# Patient Record
Sex: Female | Born: 1965
Health system: Southern US, Community
[De-identification: ages and names within clinical notes are randomized; demographics above are authoritative.]

## PROBLEM LIST (undated history)

## (undated) DIAGNOSIS — M549 Dorsalgia, unspecified: Secondary | ICD-10-CM

## (undated) DIAGNOSIS — Z87442 Personal history of urinary calculi: Secondary | ICD-10-CM

## (undated) DIAGNOSIS — R011 Cardiac murmur, unspecified: Secondary | ICD-10-CM

## (undated) DIAGNOSIS — E7800 Pure hypercholesterolemia, unspecified: Secondary | ICD-10-CM

## (undated) DIAGNOSIS — E119 Type 2 diabetes mellitus without complications: Secondary | ICD-10-CM

## (undated) DIAGNOSIS — I10 Essential (primary) hypertension: Secondary | ICD-10-CM

## (undated) DIAGNOSIS — K760 Fatty (change of) liver, not elsewhere classified: Secondary | ICD-10-CM

## (undated) DIAGNOSIS — N289 Disorder of kidney and ureter, unspecified: Secondary | ICD-10-CM

## (undated) DIAGNOSIS — N200 Calculus of kidney: Secondary | ICD-10-CM

## (undated) DIAGNOSIS — M5135 Other intervertebral disc degeneration, thoracolumbar region: Secondary | ICD-10-CM

## (undated) HISTORY — PX: ADENOIDECTOMY: SUR15

## (undated) HISTORY — PX: OOPHORECTOMY: SHX86

## (undated) HISTORY — PX: COLONOSCOPY: SHX174

## (undated) HISTORY — PX: BACK SURGERY: SHX140

## (undated) HISTORY — PX: ABDOMINAL HYSTERECTOMY: SHX81

## (undated) HISTORY — PX: TONSILLECTOMY: SUR1361

---

## 2010-10-31 ENCOUNTER — Emergency Department (HOSPITAL_COMMUNITY): Admission: EM | Admit: 2010-10-31 | Discharge: 2010-11-01 | Payer: Self-pay | Admitting: Emergency Medicine

## 2011-02-28 LAB — COMPREHENSIVE METABOLIC PANEL
Albumin: 3.4 g/dL — ABNORMAL LOW (ref 3.5–5.2)
Alkaline Phosphatase: 51 U/L (ref 39–117)
BUN: 18 mg/dL (ref 6–23)
CO2: 24 mEq/L (ref 19–32)
Chloride: 102 mEq/L (ref 96–112)
GFR calc non Af Amer: >60 mL/min (ref >60)
Glucose, Bld: 106 mg/dL — ABNORMAL HIGH (ref 70–99)
Potassium: 3.9 mEq/L (ref 3.5–5.1)
Total Bilirubin: 0.3 mg/dL (ref 0.3–1.2)

## 2011-02-28 LAB — CBC
HCT: 37.5 % (ref 36.0–46.0)
MCH: 30.4 pg (ref 26.0–34.0)
MCV: 88.4 fL (ref 78.0–100.0)
RBC: 4.24 MIL/uL (ref 3.87–5.11)
WBC: 8.9 10*3/uL (ref 4.0–10.5)

## 2011-02-28 LAB — POCT CARDIAC MARKERS: Myoglobin, poc: 49.3 ng/mL (ref 12–200)

## 2012-09-26 ENCOUNTER — Emergency Department (HOSPITAL_COMMUNITY): Payer: Managed Care, Other (non HMO)

## 2012-09-26 ENCOUNTER — Ambulatory Visit (INDEPENDENT_AMBULATORY_CARE_PROVIDER_SITE_OTHER): Payer: Managed Care, Other (non HMO) | Admitting: Family Medicine

## 2012-09-26 ENCOUNTER — Emergency Department (HOSPITAL_COMMUNITY)
Admission: EM | Admit: 2012-09-26 | Discharge: 2012-09-26 | Disposition: A | Discharge status: Home / Self Care | Payer: Managed Care, Other (non HMO) | Attending: Emergency Medicine | Admitting: Emergency Medicine

## 2012-09-26 ENCOUNTER — Encounter (HOSPITAL_COMMUNITY): Payer: Self-pay | Admitting: Emergency Medicine

## 2012-09-26 VITALS — BP 138/74 | HR 85 | Temp 97.9°F | Resp 16 | Ht 61.0 in | Wt 186.0 lb

## 2012-09-26 DIAGNOSIS — R51 Headache: Secondary | ICD-10-CM

## 2012-09-26 DIAGNOSIS — R42 Dizziness and giddiness: Secondary | ICD-10-CM | POA: Insufficient documentation

## 2012-09-26 LAB — POCT I-STAT, CHEM 8
Calcium, Ion: 1.16 mmol/L (ref 1.12–1.23)
Creatinine, Ser: 1 mg/dL (ref 0.50–1.10)
Glucose, Bld: 101 mg/dL — ABNORMAL HIGH (ref 70–99)
Hemoglobin: 14.3 g/dL (ref 12.0–15.0)
Potassium: 4.1 mEq/L (ref 3.5–5.1)

## 2012-09-26 MED ORDER — ONDANSETRON HCL 4 MG/2ML IJ SOLN
4.0000 mg | Freq: Once | INTRAMUSCULAR | Status: AC
  Administered 2012-09-26: 4 mg via INTRAVENOUS
  Filled 2012-09-26: qty 2

## 2012-09-26 MED ORDER — MORPHINE SULFATE 4 MG/ML IJ SOLN
4.0000 mg | Freq: Once | INTRAMUSCULAR | Status: AC
  Administered 2012-09-26: 4 mg via INTRAVENOUS
  Filled 2012-09-26: qty 1

## 2012-09-26 MED ORDER — KETOROLAC TROMETHAMINE 30 MG/ML IJ SOLN
30.0000 mg | Freq: Once | INTRAMUSCULAR | Status: AC
  Administered 2012-09-26: 30 mg via INTRAVENOUS
  Filled 2012-09-26: qty 1

## 2012-09-26 NOTE — ED Provider Notes (Signed)
History     CSN: [id number]  Arrival date & time 09/26/12  1637   First MD Initiated Contact with Patient 09/26/12 1638      Chief Complaint  Patient presents with  . Headache    (Consider location/radiation/quality/duration/timing/severity/associated sxs/prior treatment) HPI Comments: Patient sent from [facility] for evaluation of headache.  Pt reports she was awoken this morning by a sudden sharp pain in the top of her head.  The pain is described as stabbing, initially constant, now waxing and waning.  When at its worse, it is 10/10 intensity.  Pt had an episode of dizziness (room spinning) at urgent care, and thus was sent for further evaluation.  No palliative or exacerbating factors.  Denies focal neurological deficits.  No sensitivity to light or sound.  Denies fevers, recent falls, recent illness, sick contacts, recent travel, any tick exposures.  Per note from [facility], while patient was being seen her headache worsened and she began having dizziness and confusion about her DOB.    Patient is a 46 y.o. female presenting with headaches. The history is provided by the patient.  Headache  Pertinent negatives include no fever.    No past medical history on file.  No past surgical history on file.  No family history on file.  History  Substance Use Topics  . Smoking status: Never Smoker   . Smokeless tobacco: Not on file  . Alcohol Use: No    OB History    Grav Para Term Preterm Abortions TAB SAB Ect Mult Living                  Review of Systems  Constitutional: Negative for fever and chills.  HENT: Negative for neck pain and neck stiffness.   Eyes: Negative for visual disturbance.  Neurological: Positive for dizziness, light-headedness and headaches. Negative for weakness and numbness.       Chronic occasional numbness in her bilateral arms at night and with prolonged raising of arms.     Allergies  Review of patient's allergies indicates no  known allergies.  Home Medications  No current outpatient prescriptions on file.  BP 131/79  Pulse 78  Temp 98.5 F (36.9 C)  Resp 18  SpO2 99%  Physical Exam  Nursing note and vitals reviewed. Constitutional: She appears well-developed and well-nourished. No distress.  HENT:  Head: Normocephalic and atraumatic.  Eyes: Conjunctivae normal and EOM are normal.  Neck: Normal range of motion. Neck supple.  Pulmonary/Chest: Effort normal.  Neurological: She is alert. She has normal strength. No cranial nerve deficit or sensory deficit. She exhibits normal muscle tone. She displays a negative Romberg sign. Coordination and gait normal. GCS eye subscore is 4. GCS verbal subscore is 5. GCS motor subscore is 6.       CN II-XII intact, EOMs intact, no pronator drift, grip strengths equal bilaterally; strength 5/5 in all extremities, sensation intact in all extremities; finger to nose, heel to shin, rapid alternating movements normal; gait is normal.     Skin: She is not diaphoretic.    ED Course  Procedures (including critical care time)  Labs Reviewed  POCT I-STAT, CHEM 8 - Abnormal; Notable for the following:    Glucose, Bld 101 (*)     All other components within normal limits   Ct Head Wo Contrast  09/26/2012  *RADIOLOGY REPORT*  Clinical Data: Sharp pain involving the right side of the head  CT HEAD WITHOUT CONTRAST  Technique:  Contiguous axial images were obtained from the base of the skull through the vertex without contrast.  Comparison: None.  Findings:  Wallace Cullens white differentiation is maintained.  No CT evidence of acute large territory infarct.  No intraparenchymal or extra-axial mass or hemorrhage.  Normal size and configuration of the ventricles and basilar cisterns.  No midline shift.  Limited visualization of the paranasal sinuses and mastoid air cells are normal.  Regional soft tissues are normal.  No definite displaced calvarial fracture.  IMPRESSION: Negative noncontrast  head CT.   Original Report Authenticated By: Waynard Reeds, M.D.     5:39 PM Pt discussed with Dr Bebe Shaggy who will also see the patient.    6:30 PM Patient reports pain is now 5/10, down from 10/10.  Now complaining of her bilateral carpal tunnel pain and tingling.  Denies weakness.  Phalen's and tinel's tests are positive.    7:47 PM Pt reports headache is now 1/10 after toradol.  Feeling much better.  Neurological exam remains normal.  CN II-XII intact, EOMs intact, no pronator drift, grip strengths equal bilaterally; strength 5/5 in all extremities, sensation intact in all extremities; finger to nose, heel to shin, rapid alternating movements normal; gait is normal.  Plan is for d/c home.    1. Headache     MDM  Pt p/w headache, sent from [facility].  Neurologically intact on my exam.  No trauma, no meningeal signs.  No red flags for headache by history.  CT obtained given history and documentation at urgent care.  CT was negative.  Pt improved quickly with pain medication and resolved.  Pt also seen by Dr Bebe Shaggy.  Pt d/c home with PCP follow up.  Discussed all results with patient.  Pt given return precautions.  Pt verbalizes understanding and agrees with plan.           Iva, [place] 09/27/12 0106

## 2012-09-26 NOTE — Progress Notes (Signed)
[facility] [address] [place] [address] [phone]  Date:  09/26/2012   Name:  Tara Blevins   DOB:  July 27, 1966   MRN:  [id number]  PCP:  No primary provider on file.    Chief Complaint: Headache and Numbness   History of Present Illness:  Tara Blevins is a 46 y.o. very pleasant female patient who presents with the following:  She is here today to discuss some problems.  She has noted numbness in both arms for a month or more.  This problem will come and go- worse at night or when she is driving.  She has not been dropping objects.  Her hands do not feel weak, but they do feel tight.    Since this morning she has noted sharp pains in the right side of her head.  This started around 6:30 am and woke her from sleep.  This is also coming and going.  The pains will last about a minute or so- occuring every 10 minutes. She has never had a HA like this in the past.   No changes in her vision or hearing.  She knows that she has poor vision and probably needs to get glasses.    No family history of aneurysms.  There is a family history of heart problems.    She also notes a pain in the bottom of her left foot only with walking for the last month or so.    She has never been a smoker.  She is not taking any blood thinners or asthma.  She has not hit her head or had any trauma.  Tara Blevins was trying to work today- she is in [employer]- but she would be unable to work when the pains would hit and would have to stop until it passed.   There is no problem list on file for this patient.   No past medical history on file.  No past surgical history on file.  History  Substance Use Topics  . Smoking status: Never Smoker   . Smokeless tobacco: Not on file  . Alcohol Use: No    No family history on file.  No Known Allergies  Medication list has been reviewed and updated.  No current outpatient prescriptions on file prior to visit.    Review of  Systems:  As per HPI- otherwise negative. She does not typically visit doctors  Physical Examination: Filed Vitals:   09/26/12 1510  BP: 138/74  Pulse: 85  Temp: 97.9 F (36.6 C)  Resp: 16   Filed Vitals:   09/26/12 1510  Height: 5\' 1"  (1.549 m)  Weight: 186 lb (84.369 kg)   Body mass index is 35.14 kg/(m^2). Ideal Body Weight: Weight in (lb) to have BMI = 25: 132   GEN: WDWN, NAD, Non-toxic, A & O x 3.  Will stop what she is doing every 5- 10 minutes to clutch the right side of her head for a minute or so.   HEENT: Atraumatic, Normocephalic. Neck supple. No masses, No LAD.  There is no lesion on her scalp.  The scalp is not tender to touch.  Bilateral TM wnl, oropharynx normal.  PEERL,EOMI.  Limited funduscopic exam normal.    Ears and Nose: No external deformity. CV: RRR, No M/G/R. No JVD. No thrill. No extra heart sounds. PULM: CTA B, no wheezes, crackles, rhonchi. No retractions. No resp. distress. No accessory muscle use. EXTR: No c/c/e.  She has  normal strength and DTR in all extremities.  Normal perfusion.  She is not having the numbness of her arms/ hands currently.  She has tenderness on the underside of her left foot by the 1st/ 2nd metatarsal heads.  Tender to squeeze- possible neuroma NEURO Normal gait.  PSYCH: Normally interactive. Conversant. Not depressed or anxious appearing.  Calm demeanor.   We had planned to have Tara Blevins proceed to the ED with her significant other.  However, while waiting in clinic her symptoms worsened and she was having more persistent and severe head pains. She also seemed to be having some confusion about her DOB, what day of the week it is, etc.  She also noted dizziness and more persistent numbness in her hands and arms.  We then changed plans and decided to send her to the ED via EMS.    Assessment and Plan: 1. Headache    Alerted a triage nurse at [facility] about Tara Blevins who is en route with EMS  Abbe Amsterdam, MD

## 2012-09-26 NOTE — ED Notes (Signed)
Pt to ED via [facility] from [facility] with c/o sharp pain in right side of head st's pain comes and goes and feels like a knife.  Pt alert and oriented x's 3.  Skin warm and dry color appropriate.

## 2012-09-27 NOTE — ED Provider Notes (Signed)
Medical screening examination/treatment/procedure(s) were conducted as a shared visit with non-physician practitioner(s) and myself.  I personally evaluated the patient during the encounter  Pt well appearing, no distress, no focal neuro deficits.  On my exam, she was tender to palpation of scalp but no erythema/abscess noted   Joya Gaskins, MD 09/27/12 0110

## 2013-11-04 ENCOUNTER — Emergency Department (HOSPITAL_COMMUNITY)
Admission: EM | Admit: 2013-11-04 | Discharge: 2013-11-04 | Disposition: A | Discharge status: Home / Self Care | Payer: Managed Care, Other (non HMO) | Source: Home / Self Care | Attending: Family Medicine | Admitting: Family Medicine

## 2013-11-04 ENCOUNTER — Encounter (HOSPITAL_COMMUNITY): Payer: Self-pay | Admitting: Emergency Medicine

## 2013-11-04 ENCOUNTER — Emergency Department (INDEPENDENT_AMBULATORY_CARE_PROVIDER_SITE_OTHER): Payer: Managed Care, Other (non HMO)

## 2013-11-04 DIAGNOSIS — J069 Acute upper respiratory infection, unspecified: Secondary | ICD-10-CM

## 2013-11-04 MED ORDER — GUAIFENESIN-CODEINE 100-10 MG/5ML PO SOLN: 5.0000 mL | Freq: Every evening | ORAL | Status: DC | PRN

## 2013-11-04 MED ORDER — IPRATROPIUM BROMIDE 0.06 % NA SOLN: 2.0000 | Freq: Four times a day (QID) | NASAL | Status: DC

## 2013-11-04 NOTE — ED Notes (Signed)
Pt c/o pain in the back of her head onset Monday night Reports she fell on Saturday but denies head inj/trauma... She felt weak and dizzy and fell onto carpet flooring The pain will increases when she coughs or moves Has had a cold x2 weeks Denies: f/v/n/d, blurry vision, dizziness Alert w/no signs of acute distress; steady gait w/NAD

## 2013-11-04 NOTE — ED Provider Notes (Signed)
Tara Blevins is a 47 y.o. female who presents to Urgent Care today for  1) cough, nasal congestion subjective fevers and chills. This is been present for about 2 weeks. Patient is tried some over-the-counter medications which have not been very effective. She denies any chest pain palpitations nausea vomiting or diarrhea.  2) scalp contusion: Patient tripped getting out of bed Monday evening hitting the right lateral aspect of her scalp on her dresser. She denies any loss of consciousness nausea vomiting blurry vision dizziness unsteadiness. She denies any lack of coordination weakness or numbness. She notes tenderness along the scalp where she was struck but otherwise feels well.    History reviewed. No pertinent past medical history. History  Substance Use Topics  . Smoking status: Never Smoker   . Smokeless tobacco: Not on file  . Alcohol Use: No   ROS as above Medications reviewed. No current facility-administered medications for this encounter.   Current Outpatient Prescriptions  Medication Sig Dispense Refill  . guaiFENesin-codeine 100-10 MG/5ML syrup Take 5 mLs by mouth at bedtime as needed for cough.  120 mL  0  . ipratropium (ATROVENT) 0.06 % nasal spray Place 2 sprays into both nostrils 4 (four) times daily.  15 mL  1    Exam:  BP 133/97  Pulse 85  Temp(Src) 98.5 F (36.9 C) (Oral)  Resp 20  SpO2 97% Gen: Well NAD HEENT: EOMI,  MMM, posterior pharynx with cobblestoning. Tympanic membranes are normal appearing bilaterally. PERRLA Scalp: Tender area along the right lateral skull posterior and superior to the ear.  Lungs: CTABL Nl WOB Heart: RRR no MRG Abd: NABS, NT, ND Exts: Non edematous BL  LE, warm and well perfused.  Neuro: Alert and oriented cranial nerves intact coordination intact balance is intact gait intact strength is intact.  No results found for this or any previous visit (from the past 24 hour(s)). Dg Chest 2 View  11/04/2013   CLINICAL DATA:   47 year old female cough and congestion. Headache x2 weeks. Initial encounter.  EXAM: CHEST  2 VIEW  COMPARISON:  [facility] chest radiographs 12/31/2012.  FINDINGS: mildly lower lung volumes on the frontal view. Normal cardiac size and mediastinal contours. Visualized tracheal air column is within normal limits. Lungs remain clear. No pneumothorax or effusion. No acute osseous abnormality identified.  IMPRESSION: No acute cardiopulmonary abnormality.   Electronically Signed   By: Augusto Gamble M.D.   On: 11/04/2013 12:13    Assessment and Plan: 47 y.o. female with  1) viral URI: Plan for symptomatic management Atrovent nasal spray, codeine containing cough medication, and over-the-counter pain medication. 2) scalp contusion: No signs of intracranial process. Plan first symptomatic management watchful waiting and treatment with ibuprofen and Tylenol.  Followup with primary care provider or return if worsening. At that time may need CT scan. Discussed warning signs or symptoms. Please see discharge instructions. Patient expresses understanding.      Rodolph Bong, MD 11/04/13 [phone]

## 2016-10-17 ENCOUNTER — Encounter: Payer: Self-pay | Admitting: Physician Assistant

## 2016-10-17 ENCOUNTER — Ambulatory Visit (INDEPENDENT_AMBULATORY_CARE_PROVIDER_SITE_OTHER): Payer: Managed Care, Other (non HMO)

## 2016-10-17 ENCOUNTER — Ambulatory Visit (INDEPENDENT_AMBULATORY_CARE_PROVIDER_SITE_OTHER): Payer: Managed Care, Other (non HMO) | Admitting: Physician Assistant

## 2016-10-17 VITALS — BP 122/82 | HR 86 | Temp 98.3°F | Resp 16 | Ht 61.0 in | Wt 187.8 lb

## 2016-10-17 DIAGNOSIS — M5442 Lumbago with sciatica, left side: Secondary | ICD-10-CM | POA: Diagnosis not present

## 2016-10-17 DIAGNOSIS — M6281 Muscle weakness (generalized): Secondary | ICD-10-CM | POA: Diagnosis not present

## 2016-10-17 DIAGNOSIS — M5417 Radiculopathy, lumbosacral region: Secondary | ICD-10-CM

## 2016-10-17 MED ORDER — CYCLOBENZAPRINE HCL 10 MG PO TABS: 10.0000 mg | ORAL_TABLET | Freq: Three times a day (TID) | ORAL | 0 refills | Status: DC | PRN

## 2016-10-17 MED ORDER — PREDNISONE 20 MG PO TABS: ORAL_TABLET | ORAL | 0 refills | Status: DC

## 2016-10-17 NOTE — Progress Notes (Signed)
Tara Blevins  MRN: [id number] DOB: Jun 02, 1966  PCP: Default, Provider, MD  Subjective:  Pt is a 50 year old female presents to clinic for low back pain x one week. Last week she was pushing her cleaning cart when she felt pain in her low back, "felt like somebody punched me". Pain radiates down back of left leg to bottom of foot. States when she walks "feels like pins and needles" on her left heel.  Pain is present all day, does not get better. States when she sits for a long time she notes a tingling feeling to left leg. Notes associated weakness of her left thigh. Back pain keeps her up at night. Sleeps with pillow between leg, doesn't help. Nothing makes it better. Standing up and sitting for long periods makes it worse. She has tried Aleve, ibuprofen 800 mg once, heating pad, stretches, didn't help.  Denies saddle paresthesias, loss of bowel or bladder function.  No history of back pain or injury.   She has two jobs: [employer]. Is a cook at [employer].   Notes she is going on vacation Nov 15-20.  Review of Systems  Constitutional: Positive for activity change (due to back pain).  Cardiovascular: Negative.   Gastrointestinal: Negative for constipation and diarrhea.  Genitourinary: Negative for difficulty urinating, frequency, pelvic pain and urgency.  Musculoskeletal: Positive for back pain and gait problem. Negative for joint swelling, neck pain and neck stiffness.  Neurological: Positive for weakness (left leg). Negative for numbness.    There are no active problems to display for this patient.   Current Outpatient Prescriptions on File Prior to Visit  Medication Sig Dispense Refill  . guaiFENesin-codeine 100-10 MG/5ML syrup Take 5 mLs by mouth at bedtime as needed for cough. 120 mL 0  . ipratropium (ATROVENT) 0.06 % nasal spray Place 2 sprays into both nostrils 4 (four) times daily. (Patient not taking: Reported on 10/17/2016) 15 mL 1   No  current facility-administered medications on file prior to visit.     No Known Allergies  Objective:  BP 122/82 (BP Location: Right Arm, Patient Position: Sitting, Cuff Size: Small)   Pulse 86   Temp 98.3 F (36.8 C) (Oral)   Resp 16   Ht 5\' 1"  (1.549 m)   Wt 187 lb 12.8 oz (85.2 kg)   SpO2 97%   BMI 35.48 kg/m   Physical Exam  Constitutional: She is oriented to person, place, and time and well-developed, well-nourished, and in no distress. No distress.  Cardiovascular: Normal rate, regular rhythm and normal heart sounds.   Neurological: She is alert and oriented to person, place, and time. She has an abnormal Straight Leg Raise Test. Gait normal. GCS score is 15.  Strength is 4/5 withdorsal, plantar flexion, flexion and extension of low leg against resistance on left side. Strength is 5/5 on right.  Not able to appreciate reflexes b/l, as patient could not relax.  Abnormal sensation dorsal and plantar aspect of left foot and posterior lower left leg compared to right. She describes it as "tingling". Patient is able to walk on toes and heels.   Skin: Skin is warm and dry.  Psychiatric: Mood, memory, affect and judgment normal.  Vitals reviewed.   Dg Lumbar Spine Complete  Result Date: 10/17/2016 CLINICAL DATA:  Low back pain with sciatica EXAM: LUMBAR SPINE - COMPLETE 4+ VIEW COMPARISON:  05/19/2010 FINDINGS: Five non rib-bearing lumbar type vertebra. SI joints are patent. Minimal retrolisthesis  of L5 on S1. Lumbar alignment otherwise unremarkable. No fracture. Vertebral body heights are maintained. Anterior osteophytes present at most levels of the lumbar spine. Multilevel degenerative changes of the lower thoracic spine with vacuum discs and anterior osteophytes. IMPRESSION: Mild degenerative changes.  No acute osseous abnormality. Electronically Signed   By: Jasmine PangKim  Fujinaga M.D.   On: 10/17/2016 15:59   Assessment and Plan :  1. Lumbosacral radiculopathy 2. Acute bilateral low  back pain with left-sided sciatica 3. Muscle weakness of lower extremity  - predniSONE (DELTASONE) 20 MG tablet; Take 3 PO QAM x3days, 2 PO QAM x3days, 1 PO QAM x3days  Dispense: 18 tablet; Refill: 0 - cyclobenzaprine (FLEXERIL) 10 MG tablet; Take 1 tablet (10 mg total) by mouth 3 (three) times daily as needed for muscle spasms.  Dispense: 30 tablet; Refill: 0 - DG Lumbar Spine Complete; Future - Work note written for a few days off, instructed patient to rest - Patient instructed to RTC as soon as possible if her symptoms of weakness worsen/do not improve or if any saddle paresthesia or loss of bowel or bladder function develops. Consider referral to ortho/neuro surgery at that time for further imaging and work-up.  - Advised patient to follow up in two weeks before she leaves for vacation to monitor improvement. Consider referral to ortho if weakness is still present. Consider physical therapy if some improvemt. She understands and agrees with plan.   Marco CollieWhitney Terris Bodin, PA-C  [facility] 10/17/2016 2:59 PM

## 2016-10-17 NOTE — Patient Instructions (Addendum)
Please take these medications as prescribed.  Return to clinic if your symptoms do not improve or if they worsen. We may refer you to orthopedics at that time.  Herniated discs may take several weeks (3-4) for pain to subside. If you are still experiencing pain after a few weeks, we can refer you to physical therapy.  Please rest and avoid lifting or any activity that will strain your low back. If you are feeling better, please perform light stretches and walk for exercise.    Herniated Disk A herniated disk occurs when a disk in your spine bulges out too far. This condition is also called a ruptured disk or slipped disk. Your spine (backbone) is made up of bones called vertebrae. Between each pair of vertebrae is an oval disk with a soft, spongy center that acts as a shock absorber when you move. The spongy center is surrounded by a tough outer ring. When you have a herniated disk, the spongy center of the disk bulges out or ruptures through the outer ring. A herniated disk can press on a nerve between your vertebrae and cause pain. A herniated disk can occur anywhere in your back or neck area, but the lower back is the most common spot. CAUSES  In many cases, a herniated disk occurs just from getting older. As you age, the spongy insides of your disks tend to shrink and dry out. A herniated disk can result from gradual wear and tear. Injury or sudden strain can also cause a herniated disk.  RISK FACTORS Aging is the main risk factor for a herniated disk. Other risk factors include:  Being a man between the ages of 47 and 70 years.  Having a job that requires heavy lifting, bending, or twisting.  Having a job that requires long hours of driving.  Not getting enough exercise.  Being overweight.  Smoking. SIGNS AND SYMPTOMS  Signs and symptoms depend on which disk is herniated.  For a herniated disk in the lower back, you may have sharp pain in:  One part of your leg, hip, or  buttocks.  The back of your calf.  The top or sole of your foot (sciatica).   For a herniated disk in the neck, you may feel pain:  When you move your neck.  Near or over your shoulder blade.  That moves to your upper arm, forearm, or fingers.   You may also have muscle weakness. It may be hard to:  Lift your leg or arm.  Stand on your toes.  Squeeze tightly with one of your hands.  Other symptoms can include:  Numbness or tingling in the affected areas of your body.  Loss of bladder or bowel control. This is a rare but serious sign of a severe herniated disk in the lower back. DIAGNOSIS  Your health care provider will do a physical exam. During this exam, you may have to move certain body parts or assume various positions. For example, your health care provider may do the straight-leg test. This is a good way to test for a herniated disk in your lower back. In this test, the health care provider lifts your leg while you lie on your back. This is to see if you feel pain down your leg. Your health care provider will also check for numbness or loss of feeling.  Your health care provider will also check your:  Reflexes.  Muscle strength.  Posture.  Other tests may be done to help in making  a diagnosis. These may include:  An X-ray of the spine to rule out other causes of back pain.   Other imaging studies, such as an MRI or CT scan. This is to check whether the herniated disk is pressing on your spinal canal.  Electromyography (EMG). This test checks the nerves that control muscles. It is sometimes used to identify the specific area of nerve involvement.  TREATMENT  In many cases, herniated disk symptoms go away over a period of days or weeks. You will most likely be free of symptoms in 3-4 months. Treatment may include the following:  The initial treatment for a herniated disk is ashort period of rest.  Bed rest is often limited to 1 or 2 days. Resting for too  long delays recovery.  If you have a herniated disk in your lower back, you should avoid sitting as much as possible because sitting increases pressure on the disk.  Medicines. These may include:   Nonsteroidal anti-inflammatory drugs (NSAIDs).  Muscle relaxants for back spasms.  Narcotic pain medicine if your pain is very bad.   Steroid injections. You may need these along the involved nerve root to help control pain. The steroid is injected in the area of the herniated disk. It helps by reducing swelling around the disk.  Physical therapy. This may include exercises to strengthen the muscles that help support your spine.   You may need surgery if other treatments do not work.  HOME CARE INSTRUCTIONS Follow all your health care provider's instructions. These may include:  Take all medicines as directed by your health care provider.  Rest for 2 days and then start moving.  Do not sit or stand for long periods of time.  Maintain good posture when sitting and standing.  Avoid movements that cause pain, such as bending or lifting.  When you are able to start lifting things again:  [place] with your knees.  Keep your back straight.  Hold heavy objects close to your body.  If you are overweight, ask your health care provider to help you start a weight-loss program.  When you are able to start exercising, ask your health care provider how much and what type of exercise is best for you.  Work with a physical therapist on stretching and strengthening exercises for your back.  Do not wear high-heeled shoes.  Do not sleep on your belly.  Do not smoke.  Keep all follow-up visits as directed by your health care provider. SEEK MEDICAL CARE IF:  You have back or neck pain that is not getting better after 4 weeks.  You have very bad pain in your back or neck.  You develop numbness, tingling, or weakness along with pain. SEEK IMMEDIATE MEDICAL CARE IF:   You have  numbness, tingling, or weakness that makes you unable to use your arms or legs.  You lose control of your bladder or bowels.  You have dizziness or fainting.  You have shortness of breath.  MAKE SURE YOU:   Understand these instructions.  Will watch your condition.  Will get help right away if you are not doing well or get worse.   This information is not intended to replace advice given to you by your health care provider. Make sure you discuss any questions you have with your health care provider.   Document Released: 12/01/2000 Document Revised: 12/25/2014 Document Reviewed: 11/07/2013 Elsevier Interactive Patient Education [employer].   Thank you for coming in today. I hope you  feel we met your needs.  Feel free to call [facility] if you have any questions or further requests.  Please consider signing up for MyChart if you do not already have it, as this is a great way to communicate with me.  Best,  Whitney McVey, PA-C   IF you received an x-ray today, you will receive an invoice from [facility]. Please contact [facility] at [phone] with questions or concerns regarding your invoice.   IF you received labwork today, you will receive an invoice from [employer]. Please contact [facility] at [phone] with questions or concerns regarding your invoice.   Our billing staff will not be able to assist you with questions regarding bills from these companies.  You will be contacted with the lab results as soon as they are available. The fastest way to get your results is to activate your My Chart account. Instructions are located on the last page of this paperwork. If you have not heard from [place] regarding the results in 2 weeks, please contact this office.

## 2016-11-13 ENCOUNTER — Encounter (HOSPITAL_BASED_OUTPATIENT_CLINIC_OR_DEPARTMENT_OTHER): Payer: Self-pay

## 2016-11-13 ENCOUNTER — Emergency Department (HOSPITAL_BASED_OUTPATIENT_CLINIC_OR_DEPARTMENT_OTHER)
Admission: EM | Admit: 2016-11-13 | Discharge: 2016-11-13 | Disposition: A | Discharge status: Home / Self Care | Payer: Managed Care, Other (non HMO) | Attending: Emergency Medicine | Admitting: Emergency Medicine

## 2016-11-13 DIAGNOSIS — N3001 Acute cystitis with hematuria: Secondary | ICD-10-CM | POA: Insufficient documentation

## 2016-11-13 DIAGNOSIS — R319 Hematuria, unspecified: Secondary | ICD-10-CM | POA: Diagnosis present

## 2016-11-13 LAB — URINALYSIS, ROUTINE W REFLEX MICROSCOPIC
BILIRUBIN URINE: NEGATIVE
Glucose, UA: NEGATIVE mg/dL
KETONES UR: NEGATIVE mg/dL
NITRITE: NEGATIVE
PROTEIN: 30 mg/dL — AB
Specific Gravity, Urine: 1.027 (ref 1.005–1.030)
pH: 5 (ref 5.0–8.0)

## 2016-11-13 LAB — URINE MICROSCOPIC-ADD ON

## 2016-11-13 MED ORDER — CEPHALEXIN 500 MG PO CAPS: 500.0000 mg | ORAL_CAPSULE | Freq: Three times a day (TID) | ORAL | 0 refills | Status: AC

## 2016-11-13 MED FILL — CEPHALEXIN 500 MG CAPSULE: 500 | 7 days supply | Qty: 21 | Fill #0

## 2016-11-13 NOTE — ED Triage Notes (Signed)
C/o blood in urine x today-NAD-steady gait

## 2016-11-13 NOTE — ED Notes (Signed)
ED Provider at bedside. 

## 2016-11-13 NOTE — ED Provider Notes (Signed)
[facility] Provider Note   CSN: [id number] Arrival date & time: 11/13/16  1435   By signing my name below, I, Teofilo PodMatthew P. Jamison, attest that this documentation has been prepared under the direction and in the presence of Alvira MondayErin Maurie Musco, MD . Electronically Signed: Teofilo PodMatthew P. Jamison, ED Scribe. 11/13/2016. 3:27 PM.   History   Chief Complaint Chief Complaint  Patient presents with  . Hematuria    The history is provided by the patient. No language interpreter was used.   HPI Comments:  Tara Blevins is a 50 y.o. female who presents to the Emergency Department complaining of multiple episodes of hematuria that began today. Pt reports that today at work she urinated at 1145 and her urine was bright red. Pt notes that she has been fatigued for the past week. Pt complains of associated abdominal pain, back pain, chills. No alleviating factors noted. Pt reports PSHx of hysterectomy and oophorectomy. Pt denies injury/trauma, and denies any hx of UTI. Pt denies dysuria, vaginal discharge, vaginal bleeding, constipation, diarrhea, cough, chest pain, SOB, rhinorrhea.   History reviewed. No pertinent past medical history.  There are no active problems to display for this patient.   Past Surgical History:  Procedure Laterality Date  . ABDOMINAL HYSTERECTOMY    . ADENOIDECTOMY    . OOPHORECTOMY    . TONSILLECTOMY      OB History    No data available       Home Medications    Prior to Admission medications   Medication Sig Start Date End Date Taking? Authorizing Provider  cephALEXin (KEFLEX) 500 MG capsule Take 1 capsule (500 mg total) by mouth 3 (three) times daily. 11/13/16 11/20/16  Alvira MondayErin Trudie Cervantes, MD    Family History Family History  Problem Relation Age of Onset  . Heart disease Father     Social History Social History  Substance Use Topics  . Smoking status: Never Smoker  . Smokeless tobacco: Never Used  . Alcohol use No     Allergies     Tomato   Review of Systems Review of Systems  Constitutional: Positive for chills and fatigue. Negative for fever.  HENT: Negative for rhinorrhea and sore throat.   Eyes: Negative for visual disturbance.  Respiratory: Negative for cough and shortness of breath.   Cardiovascular: Negative for chest pain.  Gastrointestinal: Positive for abdominal pain. Negative for constipation and diarrhea.  Genitourinary: Positive for frequency (chronic) and hematuria. Negative for difficulty urinating, dysuria, vaginal bleeding and vaginal discharge.  Musculoskeletal: Positive for back pain. Negative for neck pain.  Skin: Negative for rash.  Neurological: Negative for syncope and headaches.  All other systems reviewed and are negative.    Physical Exam Updated Vital Signs BP 146/91 (BP Location: Right Arm)   Pulse 118   Temp 98 F (36.7 C) (Oral)   Resp 18   Ht 5\' 1"  (1.549 m)   Wt 189 lb 1.6 oz (85.8 kg)   SpO2 99%   BMI 35.73 kg/m   Physical Exam  Constitutional: She is oriented to person, place, and time. She appears well-developed and well-nourished. No distress.  HENT:  Head: Normocephalic and atraumatic.  Eyes: Conjunctivae and EOM are normal.  Neck: Normal range of motion. Neck supple.  Cardiovascular: Normal rate, regular rhythm, normal heart sounds and intact distal pulses.  Exam reveals no gallop and no friction rub.   No murmur heard. Pulmonary/Chest: Effort normal and breath sounds normal. No respiratory distress. She has no wheezes.  She has no rales.  Abdominal: Soft. She exhibits no distension. There is no tenderness. There is no rebound and no guarding.  No cva tenderness  Musculoskeletal: She exhibits no edema or tenderness.  Neurological: She is alert and oriented to person, place, and time.  Skin: Skin is warm and dry. No rash noted. She is not diaphoretic. No erythema.  Psychiatric: She has a normal mood and affect.  Nursing note and vitals reviewed.    ED  Treatments / Results  DIAGNOSTIC STUDIES:  Oxygen Saturation is 99% on RA, normal by my interpretation.    COORDINATION OF CARE:  3:27 PM Discussed treatment plan with pt at bedside and pt agreed to plan.   Labs (all labs ordered are listed, but only abnormal results are displayed) Labs Reviewed  URINALYSIS, ROUTINE W REFLEX MICROSCOPIC (NOT AT [facility]) - Abnormal; Notable for the following:       Result Value   Color, Urine AMBER (*)    APPearance CLOUDY (*)    Hgb urine dipstick LARGE (*)    Protein, ur 30 (*)    Leukocytes, UA SMALL (*)    All other components within normal limits  URINE MICROSCOPIC-ADD ON - Abnormal; Notable for the following:    Squamous Epithelial / LPF 0-5 (*)    Bacteria, UA MANY (*)    All other components within normal limits    EKG  EKG Interpretation None       Radiology No results found.  Procedures Procedures (including critical care time)  Medications Ordered in ED Medications - No data to display   Initial Impression / Assessment and Plan / ED Course  I have reviewed the triage vital signs and the nursing notes.  Pertinent labs & imaging results that were available during my care of the patient were reviewed by me and considered in my medical decision making (see chart for details).  Clinical Course    50 year old female presents with concern for hematuria beginning today. History is not consistent with nephrolithiasis. Urinalysis shows large blood, many bacteria without squamous cells, concerning for urinary tract infection. We'll treat with 1 week of Keflex. No fever, no sign of pyelonephritis on exam.  Provided number to Urology to call in 3 wk if hematuria does not resolve. Patient discharged in stable condition with understanding of reasons to return.   Final Clinical Impressions(s) / ED Diagnoses   Final diagnoses:  Acute cystitis with hematuria    New Prescriptions Discharge Medication List as of 11/13/2016  3:32 PM      START taking these medications   Details  cephALEXin (KEFLEX) 500 MG capsule Take 1 capsule (500 mg total) by mouth 3 (three) times daily., Starting Mon 11/13/2016, Until Mon 11/20/2016, Print      I personally performed the services described in this documentation, which was scribed in my presence. The recorded information has been reviewed and is accurate.     Alvira MondayErin Demorio Seeley, MD 11/13/16 [phone]

## 2016-11-14 ENCOUNTER — Encounter (HOSPITAL_BASED_OUTPATIENT_CLINIC_OR_DEPARTMENT_OTHER): Payer: Self-pay | Admitting: Emergency Medicine

## 2016-11-14 ENCOUNTER — Emergency Department (HOSPITAL_BASED_OUTPATIENT_CLINIC_OR_DEPARTMENT_OTHER)
Admission: EM | Admit: 2016-11-14 | Discharge: 2016-11-14 | Disposition: A | Discharge status: Home / Self Care | Payer: Managed Care, Other (non HMO) | Attending: Emergency Medicine | Admitting: Emergency Medicine

## 2016-11-14 ENCOUNTER — Emergency Department (HOSPITAL_BASED_OUTPATIENT_CLINIC_OR_DEPARTMENT_OTHER): Payer: Managed Care, Other (non HMO)

## 2016-11-14 DIAGNOSIS — R109 Unspecified abdominal pain: Secondary | ICD-10-CM

## 2016-11-14 DIAGNOSIS — N12 Tubulo-interstitial nephritis, not specified as acute or chronic: Secondary | ICD-10-CM | POA: Diagnosis not present

## 2016-11-14 DIAGNOSIS — R319 Hematuria, unspecified: Secondary | ICD-10-CM

## 2016-11-14 HISTORY — DX: Disorder of kidney and ureter, unspecified: N28.9

## 2016-11-14 LAB — URINALYSIS, ROUTINE W REFLEX MICROSCOPIC
BILIRUBIN URINE: NEGATIVE
GLUCOSE, UA: 100 mg/dL — AB
Ketones, ur: NEGATIVE mg/dL
Leukocytes, UA: NEGATIVE
Nitrite: NEGATIVE
Protein, ur: 30 mg/dL — AB
SPECIFIC GRAVITY, URINE: 1.015 (ref 1.005–1.030)
pH: 5.5 (ref 5.0–8.0)

## 2016-11-14 LAB — COMPREHENSIVE METABOLIC PANEL
ALBUMIN: 3.5 g/dL (ref 3.5–5.0)
ALT: 26 U/L (ref 14–54)
AST: 26 U/L (ref 15–41)
Alkaline Phosphatase: 50 U/L (ref 38–126)
Anion gap: 8 (ref 5–15)
BILIRUBIN TOTAL: 0.4 mg/dL (ref 0.3–1.2)
BUN: 14 mg/dL (ref 6–20)
CHLORIDE: 107 mmol/L (ref 101–111)
CO2: 24 mmol/L (ref 22–32)
CREATININE: 0.93 mg/dL (ref 0.44–1.00)
Calcium: 8.6 mg/dL — ABNORMAL LOW (ref 8.9–10.3)
GFR calc Af Amer: >60 mL/min (ref >60)
GLUCOSE: 207 mg/dL — AB (ref 65–99)
Potassium: 4.1 mmol/L (ref 3.5–5.1)
Sodium: 139 mmol/L (ref 135–145)
TOTAL PROTEIN: 6.9 g/dL (ref 6.5–8.1)

## 2016-11-14 LAB — CBC WITH DIFFERENTIAL/PLATELET
BASOS ABS: 0 10*3/uL (ref 0.0–0.1)
BASOS PCT: 1 %
Eosinophils Absolute: 0.2 10*3/uL (ref 0.0–0.7)
Eosinophils Relative: 3 %
HEMATOCRIT: 35.3 % — AB (ref 36.0–46.0)
Hemoglobin: 12.1 g/dL (ref 12.0–15.0)
LYMPHS PCT: 23 %
Lymphs Abs: 1.8 10*3/uL (ref 0.7–4.0)
MCH: 30.6 pg (ref 26.0–34.0)
MCHC: 34.3 g/dL (ref 30.0–36.0)
MCV: 89.4 fL (ref 78.0–100.0)
Monocytes Absolute: 0.8 10*3/uL (ref 0.1–1.0)
Monocytes Relative: 10 %
NEUTROS ABS: 5.2 10*3/uL (ref 1.7–7.7)
NEUTROS PCT: 63 %
Platelets: 282 10*3/uL (ref 150–400)
RBC: 3.95 MIL/uL (ref 3.87–5.11)
RDW: 12.8 % (ref 11.5–15.5)
WBC: 8.1 10*3/uL (ref 4.0–10.5)

## 2016-11-14 LAB — URINE MICROSCOPIC-ADD ON

## 2016-11-14 MED ORDER — KETOROLAC TROMETHAMINE 30 MG/ML IJ SOLN
30.0000 mg | Freq: Once | INTRAMUSCULAR | Status: AC
  Administered 2016-11-14: 30 mg via INTRAVENOUS
  Filled 2016-11-14: qty 1

## 2016-11-14 MED ORDER — HYDROCODONE-ACETAMINOPHEN 5-325 MG PO TABS: 1.0000 | ORAL_TABLET | ORAL | 0 refills | Status: DC | PRN

## 2016-11-14 MED ORDER — HYDROMORPHONE HCL 1 MG/ML IJ SOLN
1.0000 mg | Freq: Once | INTRAMUSCULAR | Status: AC
  Administered 2016-11-14: 1 mg via INTRAVENOUS
  Filled 2016-11-14: qty 1

## 2016-11-14 MED ORDER — SODIUM CHLORIDE 0.9 % IV BOLUS (SEPSIS)
1000.0000 mL | Freq: Once | INTRAVENOUS | Status: AC
  Administered 2016-11-14: 1000 mL via INTRAVENOUS

## 2016-11-14 MED ORDER — ONDANSETRON HCL 4 MG/2ML IJ SOLN
4.0000 mg | Freq: Once | INTRAMUSCULAR | Status: AC
  Administered 2016-11-14: 4 mg via INTRAVENOUS
  Filled 2016-11-14: qty 2

## 2016-11-14 MED ORDER — ONDANSETRON 8 MG PO TBDP: 8.0000 mg | ORAL_TABLET | Freq: Three times a day (TID) | ORAL | 0 refills | Status: DC | PRN

## 2016-11-14 MED ORDER — DEXTROSE 5 % IV SOLN
1.0000 g | Freq: Once | INTRAVENOUS | Status: AC
  Administered 2016-11-14: 1 g via INTRAVENOUS
  Filled 2016-11-14: qty 10

## 2016-11-14 MED FILL — ONDANSETRON ODT 8 MG TABLET: 8 | 3 days supply | Qty: 9 | Fill #0

## 2016-11-14 MED FILL — HYDROCODON-APAP 5-325: 5-325 | 2 days supply | Qty: 12 | Fill #0

## 2016-11-14 NOTE — ED Notes (Signed)
Patient transported to CT 

## 2016-11-14 NOTE — ED Provider Notes (Signed)
TIME SEEN: 5:55 AM  CHIEF COMPLAINT: Right flank pain, nausea and vomiting, dysuria  HPI: Pt is a 50 y.o. female with history of previous kidney stones who presents emergency department right-sided flank pain, nausea, vomiting, dysuria. No fevers or chills. Pain has been increasing in the past several hours. Was seen earlier in the emergency department and diagnosed with urinary tract infection and put on Keflex. States since discharge her symptoms have acutely worsened. No diarrhea or abdominal pain. Has not had a kidney stone in many years. Has had previous lithotripsy and ureteral stents.  ROS: See HPI Constitutional: no fever  Eyes: no drainage  ENT: no runny nose   Cardiovascular:  no chest pain  Resp: no SOB  GI: no vomiting GU: no dysuria Integumentary: no rash  Allergy: no hives  Musculoskeletal: no leg swelling  Neurological: no slurred speech ROS otherwise negative  PAST MEDICAL HISTORY/PAST SURGICAL HISTORY:  Past Medical History:  Diagnosis Date  . kidney stones     MEDICATIONS:  Prior to Admission medications   Medication Sig Start Date End Date Taking? Authorizing Provider  cephALEXin (KEFLEX) 500 MG capsule Take 1 capsule (500 mg total) by mouth 3 (three) times daily. 11/13/16 11/20/16  Alvira MondayErin Schlossman, MD    ALLERGIES:  Allergies  Allergen Reactions  . Tomato     SOCIAL HISTORY:  Social History  Substance Use Topics  . Smoking status: Never Smoker  . Smokeless tobacco: Never Used  . Alcohol use No    FAMILY HISTORY: Family History  Problem Relation Age of Onset  . Heart disease Father     EXAM: BP 139/89 (BP Location: Right Arm)   Pulse 113   Temp 97.6 F (36.4 C) (Oral)   Resp 22   Ht 5\' 1"  (1.549 m)   Wt 189 lb (85.7 kg)   SpO2 97%   BMI 35.71 kg/m  CONSTITUTIONAL: Alert and oriented and responds appropriately to questions. Appears uncomfortable, chronically ill-appearing, afebrile, nontoxic HEAD: Normocephalic EYES: Conjunctivae  clear, PERRL, EOMI ENT: normal nose; no rhinorrhea; moist mucous membranes NECK: Supple, no meningismus, no nuchal rigidity, no LAD  CARD: Regular and tachycardic; S1 and S2 appreciated; no murmurs, no clicks, no rubs, no gallops RESP: Normal chest excursion without splinting or tachypnea; breath sounds clear and equal bilaterally; no wheezes, no rhonchi, no rales, no hypoxia or respiratory distress, speaking full sentences ABD/GI: Normal bowel sounds; non-distended; soft, non-tender, no rebound, no guarding, no peritoneal signs, no hepatosplenomegaly BACK:  The back appears normal and is non-tender to palpation, there is mild CVA tenderness on the right side, no midline spinal tenderness or step-off or deformity EXT: Normal ROM in all joints; non-tender to palpation; no edema; normal capillary refill; no cyanosis, no calf tenderness or swelling    SKIN: Normal color for age and race; warm; no rash NEURO: Moves all extremities equally, sensation to light touch intact diffusely, cranial nerves II through XII intact, normal speech PSYCH: The patient's mood and manner are appropriate. Grooming and personal hygiene are appropriate.  MEDICAL DECISION MAKING: Patient here with right-sided flank pain. Differential diagnosis includes kidney stone, UTI, pyelonephritis. Previous urine showed white blood cells, red blood cells and bacteria. We'll repeat urine with urine culture. We'll give IV ceftriaxone. We'll give IV fluids, Toradol, Dilaudid, Zofran for symptom control. Will obtain labs and a CT scan to evaluate for further evaluation given she has had previous urologic intervention in the past and this could be an infected kidney stone.  ED PROGRESS: 7:20 AM  Signed out to Dr. Patria Maneampos to follow up on CT and labs.  Urine shows blood but no other sign of infection other than maybe bacteria. Culture is pending.   I reviewed all nursing notes, vitals, pertinent old records, EKGs, labs, imaging (as  available).       Layla MawKristen N Noelle Hoogland, DO 11/14/16 [phone]

## 2016-11-14 NOTE — ED Triage Notes (Signed)
Pt seen here yesterday and dx with UTI. New onset R flank pain and vomiting tonight.

## 2016-11-14 NOTE — ED Provider Notes (Signed)
7:43 AM Pt feels better at this time. Will be treated for pyelo. Continue abx. Urine culture sent. CT without ureteral stone.   Keflex zofran Hydrocodone  Dg Lumbar Spine Complete  Result Date: 10/17/2016 CLINICAL DATA:  Low back pain with sciatica EXAM: LUMBAR SPINE - COMPLETE 4+ VIEW COMPARISON:  05/19/2010 FINDINGS: Five non rib-bearing lumbar type vertebra. SI joints are patent. Minimal retrolisthesis of L5 on S1. Lumbar alignment otherwise unremarkable. No fracture. Vertebral body heights are maintained. Anterior osteophytes present at most levels of the lumbar spine. Multilevel degenerative changes of the lower thoracic spine with vacuum discs and anterior osteophytes. IMPRESSION: Mild degenerative changes.  No acute osseous abnormality. Electronically Signed   By: Jasmine PangKim  Fujinaga M.D.   On: 10/17/2016 15:59   Ct Renal Stone Study  Result Date: 11/14/2016 CLINICAL DATA:  New onset right flank pain, vomiting tonight. EXAM: CT ABDOMEN AND PELVIS WITHOUT CONTRAST TECHNIQUE: Multidetector CT imaging of the abdomen and pelvis was performed following the standard protocol without IV contrast. COMPARISON:  05/19/2010 FINDINGS: Lower chest: Lung bases are clear. No effusions. Heart is normal size. Hepatobiliary: Diffuse fatty infiltration of the liver. No visible focal abnormality. Gallbladder unremarkable. Pancreas: No focal abnormality or ductal dilatation. Spleen: No focal abnormality.  Normal size. Adrenals/Urinary Tract: 8 mm nonobstructing stone in the lower pole of the left kidney. Punctate nonobstructing stones in the right kidney. No hydronephrosis. No visible ureteral stones. Numerous calcifications in the pelvis felt represent phleboliths. Adrenal glands and urinary bladder unremarkable. Stomach/Bowel: Appendix is normal. Stomach, large and small bowel grossly unremarkable. Vascular/Lymphatic: No evidence of aneurysm or adenopathy. Reproductive: Prior hysterectomy.  No adnexal mass. Other: No  free fluid or free air. Musculoskeletal: No acute bony abnormality or focal bone lesion. IMPRESSION: Bilateral nonobstructing renal stones. No hydronephrosis or visible ureteral stone. Normal appendix. Fatty infiltration of the liver. Electronically Signed   By: Charlett NoseKevin  Dover M.D.   On: 11/14/2016 07:32   I personally reviewed the imaging tests through PACS system I reviewed available ER/hospitalization records through the EMR    Azalia BilisKevin Herberta Pickron, MD 11/14/16 [phone]

## 2016-11-15 LAB — URINE CULTURE: Culture: NO GROWTH

## 2017-07-18 HISTORY — PX: LUMBAR LAMINECTOMY: SHX95

## 2017-09-21 DIAGNOSIS — M5135 Other intervertebral disc degeneration, thoracolumbar region: Secondary | ICD-10-CM

## 2017-09-21 DIAGNOSIS — N200 Calculus of kidney: Secondary | ICD-10-CM

## 2017-09-21 HISTORY — DX: Other intervertebral disc degeneration, thoracolumbar region: M51.35

## 2017-09-21 HISTORY — DX: Calculus of kidney: N20.0

## 2018-01-21 ENCOUNTER — Emergency Department (HOSPITAL_BASED_OUTPATIENT_CLINIC_OR_DEPARTMENT_OTHER)
Admission: EM | Admit: 2018-01-21 | Discharge: 2018-01-21 | Disposition: A | Discharge status: Home / Self Care | Payer: Self-pay | Attending: Emergency Medicine | Admitting: Emergency Medicine

## 2018-01-21 ENCOUNTER — Encounter (HOSPITAL_BASED_OUTPATIENT_CLINIC_OR_DEPARTMENT_OTHER): Payer: Self-pay

## 2018-01-21 ENCOUNTER — Emergency Department (HOSPITAL_BASED_OUTPATIENT_CLINIC_OR_DEPARTMENT_OTHER): Payer: Self-pay

## 2018-01-21 ENCOUNTER — Other Ambulatory Visit: Payer: Self-pay

## 2018-01-21 DIAGNOSIS — N12 Tubulo-interstitial nephritis, not specified as acute or chronic: Secondary | ICD-10-CM | POA: Insufficient documentation

## 2018-01-21 DIAGNOSIS — E119 Type 2 diabetes mellitus without complications: Secondary | ICD-10-CM | POA: Insufficient documentation

## 2018-01-21 DIAGNOSIS — I10 Essential (primary) hypertension: Secondary | ICD-10-CM | POA: Insufficient documentation

## 2018-01-21 HISTORY — DX: Pure hypercholesterolemia, unspecified: E78.00

## 2018-01-21 HISTORY — DX: Calculus of kidney: N20.0

## 2018-01-21 HISTORY — DX: Dorsalgia, unspecified: M54.9

## 2018-01-21 HISTORY — DX: Essential (primary) hypertension: I10

## 2018-01-21 HISTORY — DX: Type 2 diabetes mellitus without complications: E11.9

## 2018-01-21 LAB — I-STAT CHEM 8, ED
BUN: 20 mg/dL (ref 6–20)
CALCIUM ION: 1.21 mmol/L (ref 1.15–1.40)
Chloride: 107 mmol/L (ref 101–111)
Creatinine, Ser: 0.9 mg/dL (ref 0.44–1.00)
Glucose, Bld: 115 mg/dL — ABNORMAL HIGH (ref 65–99)
HEMATOCRIT: 40 % (ref 36.0–46.0)
Hemoglobin: 13.6 g/dL (ref 12.0–15.0)
Potassium: 4 mmol/L (ref 3.5–5.1)
SODIUM: 141 mmol/L (ref 135–145)
TCO2: 22 mmol/L (ref 22–32)

## 2018-01-21 LAB — URINALYSIS, ROUTINE W REFLEX MICROSCOPIC
Bilirubin Urine: NEGATIVE
Glucose, UA: NEGATIVE mg/dL
Ketones, ur: NEGATIVE mg/dL
LEUKOCYTES UA: NEGATIVE
NITRITE: NEGATIVE
PROTEIN: 30 mg/dL — AB
pH: 6 (ref 5.0–8.0)

## 2018-01-21 LAB — URINALYSIS, MICROSCOPIC (REFLEX)

## 2018-01-21 LAB — CBC
HCT: 38.2 % (ref 36.0–46.0)
Hemoglobin: 12.9 g/dL (ref 12.0–15.0)
MCH: 30.1 pg (ref 26.0–34.0)
MCHC: 33.8 g/dL (ref 30.0–36.0)
MCV: 89.3 fL (ref 78.0–100.0)
PLATELETS: 304 10*3/uL (ref 150–400)
RBC: 4.28 MIL/uL (ref 3.87–5.11)
RDW: 13.4 % (ref 11.5–15.5)
WBC: 8.6 10*3/uL (ref 4.0–10.5)

## 2018-01-21 MED ORDER — ONDANSETRON HCL 4 MG/2ML IJ SOLN
4.0000 mg | Freq: Once | INTRAMUSCULAR | Status: AC
  Administered 2018-01-21: 4 mg via INTRAVENOUS
  Filled 2018-01-21: qty 2

## 2018-01-21 MED ORDER — IOPAMIDOL (ISOVUE-300) INJECTION 61%
100.0000 mL | Freq: Once | INTRAVENOUS | Status: AC | PRN
  Administered 2018-01-21: 100 mL via INTRAVENOUS

## 2018-01-21 MED ORDER — HYDROCODONE-ACETAMINOPHEN 5-325 MG PO TABS: 1.0000 | ORAL_TABLET | ORAL | 0 refills | Status: DC | PRN

## 2018-01-21 MED ORDER — SODIUM CHLORIDE 0.9 % IV BOLUS (SEPSIS)
1000.0000 mL | Freq: Once | INTRAVENOUS | Status: AC
  Administered 2018-01-21: 1000 mL via INTRAVENOUS

## 2018-01-21 MED ORDER — DEXTROSE 5 % IV SOLN
1.0000 g | Freq: Once | INTRAVENOUS | Status: AC
  Administered 2018-01-21: 1 g via INTRAVENOUS
  Filled 2018-01-21: qty 10

## 2018-01-21 MED ORDER — MORPHINE SULFATE (PF) 4 MG/ML IV SOLN
4.0000 mg | Freq: Once | INTRAVENOUS | Status: AC
  Administered 2018-01-21: 4 mg via INTRAVENOUS
  Filled 2018-01-21: qty 1

## 2018-01-21 MED ORDER — ONDANSETRON 8 MG PO TBDP: 8.0000 mg | ORAL_TABLET | Freq: Three times a day (TID) | ORAL | 0 refills | Status: DC | PRN

## 2018-01-21 MED ORDER — CEPHALEXIN 500 MG PO CAPS: 500.0000 mg | ORAL_CAPSULE | Freq: Three times a day (TID) | ORAL | 0 refills | Status: DC

## 2018-01-21 MED FILL — CEPHALEXIN 500 MG CAPSULE: 500 | 7 days supply | Qty: 21 | Fill #0

## 2018-01-21 MED FILL — ONDANSETRON ODT 8 MG TABLET: 8 | 3 days supply | Qty: 10 | Fill #0

## 2018-01-21 MED FILL — HYDROCODON-APAP 5-325: 5-325 | 2 days supply | Qty: 15 | Fill #0

## 2018-01-21 NOTE — ED Triage Notes (Signed)
C/o hematuria started last night-left side abd pain x 5 days-NAD-steady gait

## 2018-01-21 NOTE — ED Notes (Signed)
Patient transported to CT 

## 2018-01-21 NOTE — ED Notes (Signed)
verbal understanding of d/c instruction and not driving for 6 hrs , pt states she has a ride

## 2018-01-21 NOTE — ED Provider Notes (Signed)
[facility] EMERGENCY DEPARTMENT Provider Note   CSN: [id number] Arrival date & time: 01/21/18  1137     History   Chief Complaint Chief Complaint  Patient presents with  . Hematuria    HPI Tara Blevins is a 52 y.o. female.  HPI 52 year old female with a history of kidney stones presents the emergency department with left-sided abdominal pain over the past 5 days with associated nausea and vomiting.  No diarrhea.  No radiation of her pain towards her left flank but is noted to be focal in the left side of her abdomen.  No history of diverticulitis.  She noted no hematuria last night.  Some urinary frequency without dysuria.  No fevers or chills.  Symptoms are moderate in severity.  No other complaints.   Past Medical History:  Diagnosis Date  . Back pain   . Diabetes mellitus without complication (HCC)   . High cholesterol   . Hypertension   . Kidney stone   . kidney stones     There are no active problems to display for this patient.   Past Surgical History:  Procedure Laterality Date  . ABDOMINAL HYSTERECTOMY    . ADENOIDECTOMY    . BACK SURGERY    . OOPHORECTOMY    . TONSILLECTOMY      OB History    No data available       Home Medications    Prior to Admission medications   Medication Sig Start Date End Date Taking? Authorizing Provider  UNKNOWN TO PATIENT    Yes [provider]  cephALEXin (KEFLEX) 500 MG capsule Take 1 capsule (500 mg total) by mouth 3 (three) times daily. 01/21/18   Azalia Bilisampos, Taren Dymek, MD  HYDROcodone-acetaminophen (NORCO/VICODIN) 5-325 MG tablet Take 1 tablet by mouth every 4 (four) hours as needed for moderate pain. 01/21/18   Azalia Bilisampos, Elven Laboy, MD  ondansetron (ZOFRAN ODT) 8 MG disintegrating tablet Take 1 tablet (8 mg total) by mouth every 8 (eight) hours as needed for nausea or vomiting. 01/21/18   Azalia Bilisampos, Shomari Matusik, MD    Family History Family History  Problem Relation Age of Onset  . Heart disease Father     Social  History Social History   Tobacco Use  . Smoking status: Never Smoker  . Smokeless tobacco: Never Used  Substance Use Topics  . Alcohol use: No  . Drug use: No     Allergies   Tomato   Review of Systems Review of Systems  All other systems reviewed and are negative.    Physical Exam Updated Vital Signs BP 121/70 (BP Location: Right Arm)   Pulse 63   Temp 98 F (36.7 C) (Oral)   Resp 18   Ht 5' (1.524 m)   Wt 79.6 kg (175 lb 8 oz)   SpO2 96%   BMI 34.27 kg/m   Physical Exam  Constitutional: She is oriented to person, place, and time. She appears well-developed and well-nourished. No distress.  HENT:  Head: Normocephalic and atraumatic.  Eyes: EOM are normal.  Neck: Normal range of motion.  Cardiovascular: Normal rate and regular rhythm.  Pulmonary/Chest: Effort normal and breath sounds normal.  Abdominal: Soft. She exhibits no distension.  Mild left-sided abdominal tenderness without guarding or rebound.  No peritoneal signs.  Musculoskeletal: Normal range of motion.  Neurological: She is alert and oriented to person, place, and time.  Skin: Skin is warm and dry.  Psychiatric: She has a normal mood and affect. Judgment normal.  Nursing note and vitals reviewed.    ED Treatments / Results  Labs (all labs ordered are listed, but only abnormal results are displayed) Labs Reviewed  URINALYSIS, ROUTINE W REFLEX MICROSCOPIC - Abnormal; Notable for the following components:      Result Value   APPearance CLOUDY (*)    Specific Gravity, Urine >1.030 (*)    Hgb urine dipstick LARGE (*)    Protein, ur 30 (*)    All other components within normal limits  URINALYSIS, MICROSCOPIC (REFLEX) - Abnormal; Notable for the following components:   Bacteria, UA FEW (*)    Squamous Epithelial / LPF 0-5 (*)    All other components within normal limits  I-STAT CHEM 8, ED - Abnormal; Notable for the following components:   Glucose, Bld 115 (*)    All other components within  normal limits  URINE CULTURE  CBC    EKG  EKG Interpretation None       Radiology Ct Abdomen Pelvis W Contrast  Result Date: 01/21/2018 CLINICAL DATA:  52 y/o F; hematuria and left-sided abdominal pains for 5 days. EXAM: CT ABDOMEN AND PELVIS WITH CONTRAST TECHNIQUE: Multidetector CT imaging of the abdomen and pelvis was performed using the standard protocol following bolus administration of intravenous contrast. CONTRAST:  ISOVUE-300 IOPAMIDOL (ISOVUE-300) INJECTION 61% COMPARISON:  05/07/2017 CT abdomen and pelvis. FINDINGS: Lower chest: No acute abnormality. Hepatobiliary: No focal liver abnormality is seen. No gallstones, gallbladder wall thickening, or biliary dilatation. Pancreas: Unremarkable. No pancreatic ductal dilatation or surrounding inflammatory changes. Spleen: Normal in size without focal abnormality. Adrenals/Urinary Tract: Normal adrenal glands. Subcentimeter renal cysts bilaterally. 3 mm nonobstructing stones in the upper and lower poles of right kidney. 9 mm stone within the left kidney pelvis having migrated from the lower pole on the prior study. No hydronephrosis or pelvicaliectasis. Urothelial enhancement of the left renal pelvis and proximal ureter. Normal bladder. Stomach/Bowel: Stomach is within normal limits. Appendix appears normal. No evidence of bowel wall thickening, distention, or inflammatory changes. Vascular/Lymphatic: No significant vascular findings are present. No enlarged abdominal or pelvic lymph nodes. Reproductive: Status post hysterectomy. No adnexal masses. Other: No abdominal wall hernia or abnormality. No abdominopelvic ascites. Musculoskeletal: Moderate multilevel spondylosis of the lumbar spine with disc bulges, loss of disc space height, and facet hypertrophy. Left L3-L4 hemilaminectomy changes. IMPRESSION: 1. 9 mm stone within left renal pelvis without obstruction. Urothelial enhancement of left renal pelvis and proximal ureter, likely associated  infectious or inflammatory pyelitis. 2. Right kidney nonobstructing kidney stones. Electronically Signed   By: Mitzi Hansen M.D.   On: 01/21/2018 16:10    Procedures Procedures (including critical care time)  Medications Ordered in ED Medications  cefTRIAXone (ROCEPHIN) 1 g in dextrose 5 % 50 mL IVPB (not administered)  morphine 4 MG/ML injection 4 mg (4 mg Intravenous Given 01/21/18 1533)  ondansetron (ZOFRAN) injection 4 mg (4 mg Intravenous Given 01/21/18 1533)  sodium chloride 0.9 % bolus 1,000 mL (0 mLs Intravenous Stopped 01/21/18 1618)  iopamidol (ISOVUE-300) 61 % injection 100 mL (100 mLs Intravenous Contrast Given 01/21/18 1547)     Initial Impression / Assessment and Plan / ED Course  I have reviewed the triage vital signs and the nursing notes.  Pertinent labs & imaging results that were available during my care of the patient were reviewed by me and considered in my medical decision making (see chart for details).     4:31 PM Likely left-sided pyelonephritis.  Urine culture sent.  No  evidence of obstructing stone at this time.  No hydronephrosis.  IV Rocephin now.  Home with Keflex.  Instructed to return the emergency department for new or worsening symptoms.  Close primary care follow-up.  Final Clinical Impressions(s) / ED Diagnoses   Final diagnoses:  Pyelonephritis    ED Discharge Orders        Ordered    ondansetron (ZOFRAN ODT) 8 MG disintegrating tablet  Every 8 hours PRN     01/21/18 1628    cephALEXin (KEFLEX) 500 MG capsule  3 times daily     01/21/18 1628    HYDROcodone-acetaminophen (NORCO/VICODIN) 5-325 MG tablet  Every 4 hours PRN     01/21/18 1628       Azalia Bilis, MD 01/21/18 1631

## 2018-01-23 LAB — URINE CULTURE: Culture: NO GROWTH

## 2018-01-24 ENCOUNTER — Ambulatory Visit (HOSPITAL_BASED_OUTPATIENT_CLINIC_OR_DEPARTMENT_OTHER): Payer: Self-pay | Admitting: Anesthesiology

## 2018-01-24 ENCOUNTER — Encounter (HOSPITAL_BASED_OUTPATIENT_CLINIC_OR_DEPARTMENT_OTHER): Payer: Self-pay | Admitting: *Deleted

## 2018-01-24 ENCOUNTER — Ambulatory Visit (HOSPITAL_BASED_OUTPATIENT_CLINIC_OR_DEPARTMENT_OTHER)
Admission: RE | Admit: 2018-01-24 | Discharge: 2018-01-24 | Disposition: A | Discharge status: Home / Self Care | Payer: Self-pay | Source: Ambulatory Visit | Attending: Urology | Admitting: Urology

## 2018-01-24 ENCOUNTER — Emergency Department (HOSPITAL_BASED_OUTPATIENT_CLINIC_OR_DEPARTMENT_OTHER)
Admission: EM | Admit: 2018-01-24 | Discharge: 2018-01-24 | Disposition: A | Discharge status: Home / Self Care | Payer: Self-pay | Attending: Emergency Medicine | Admitting: Emergency Medicine

## 2018-01-24 ENCOUNTER — Other Ambulatory Visit: Payer: Self-pay

## 2018-01-24 ENCOUNTER — Encounter (HOSPITAL_BASED_OUTPATIENT_CLINIC_OR_DEPARTMENT_OTHER)
Admission: RE | Disposition: A | Discharge status: Home / Self Care | Payer: Self-pay | Source: Ambulatory Visit | Attending: Urology

## 2018-01-24 ENCOUNTER — Encounter (HOSPITAL_BASED_OUTPATIENT_CLINIC_OR_DEPARTMENT_OTHER): Payer: Self-pay | Admitting: Emergency Medicine

## 2018-01-24 ENCOUNTER — Emergency Department (HOSPITAL_BASED_OUTPATIENT_CLINIC_OR_DEPARTMENT_OTHER): Payer: Self-pay

## 2018-01-24 ENCOUNTER — Other Ambulatory Visit: Payer: Self-pay | Admitting: Urology

## 2018-01-24 DIAGNOSIS — Z87442 Personal history of urinary calculi: Secondary | ICD-10-CM | POA: Insufficient documentation

## 2018-01-24 DIAGNOSIS — R10814 Left lower quadrant abdominal tenderness: Secondary | ICD-10-CM | POA: Insufficient documentation

## 2018-01-24 DIAGNOSIS — Z7984 Long term (current) use of oral hypoglycemic drugs: Secondary | ICD-10-CM | POA: Insufficient documentation

## 2018-01-24 DIAGNOSIS — N132 Hydronephrosis with renal and ureteral calculous obstruction: Secondary | ICD-10-CM | POA: Insufficient documentation

## 2018-01-24 DIAGNOSIS — E7800 Pure hypercholesterolemia, unspecified: Secondary | ICD-10-CM | POA: Insufficient documentation

## 2018-01-24 DIAGNOSIS — Z79899 Other long term (current) drug therapy: Secondary | ICD-10-CM | POA: Insufficient documentation

## 2018-01-24 DIAGNOSIS — I10 Essential (primary) hypertension: Secondary | ICD-10-CM | POA: Insufficient documentation

## 2018-01-24 DIAGNOSIS — E119 Type 2 diabetes mellitus without complications: Secondary | ICD-10-CM | POA: Insufficient documentation

## 2018-01-24 DIAGNOSIS — R319 Hematuria, unspecified: Secondary | ICD-10-CM | POA: Insufficient documentation

## 2018-01-24 DIAGNOSIS — Z8249 Family history of ischemic heart disease and other diseases of the circulatory system: Secondary | ICD-10-CM | POA: Insufficient documentation

## 2018-01-24 DIAGNOSIS — Z7982 Long term (current) use of aspirin: Secondary | ICD-10-CM | POA: Insufficient documentation

## 2018-01-24 HISTORY — PX: CYSTOSCOPY/RETROGRADE/URETEROSCOPY: SHX5316

## 2018-01-24 LAB — CBC WITH DIFFERENTIAL/PLATELET
Basophils Absolute: 0 10*3/uL (ref 0.0–0.1)
Basophils Relative: 0 %
EOS ABS: 0.1 10*3/uL (ref 0.0–0.7)
Eosinophils Relative: 2 %
HEMATOCRIT: 40.4 % (ref 36.0–46.0)
HEMOGLOBIN: 13.7 g/dL (ref 12.0–15.0)
LYMPHS ABS: 2.2 10*3/uL (ref 0.7–4.0)
Lymphocytes Relative: 30 %
MCH: 30.4 pg (ref 26.0–34.0)
MCHC: 33.9 g/dL (ref 30.0–36.0)
MCV: 89.6 fL (ref 78.0–100.0)
MONOS PCT: 7 %
Monocytes Absolute: 0.5 10*3/uL (ref 0.1–1.0)
NEUTROS PCT: 61 %
Neutro Abs: 4.6 10*3/uL (ref 1.7–7.7)
Platelets: 339 10*3/uL (ref 150–400)
RBC: 4.51 MIL/uL (ref 3.87–5.11)
RDW: 13.6 % (ref 11.5–15.5)
WBC: 7.5 10*3/uL (ref 4.0–10.5)

## 2018-01-24 LAB — URINALYSIS, ROUTINE W REFLEX MICROSCOPIC
Bilirubin Urine: NEGATIVE
Glucose, UA: NEGATIVE mg/dL
Ketones, ur: NEGATIVE mg/dL
NITRITE: NEGATIVE
PH: 6 (ref 5.0–8.0)
Protein, ur: 100 mg/dL — AB

## 2018-01-24 LAB — GLUCOSE, CAPILLARY
GLUCOSE-CAPILLARY: 87 mg/dL (ref 65–99)
Glucose-Capillary: 86 mg/dL (ref 65–99)

## 2018-01-24 LAB — COMPREHENSIVE METABOLIC PANEL
ALT: 35 U/L (ref 14–54)
ANION GAP: 10 (ref 5–15)
AST: 24 U/L (ref 15–41)
Albumin: 4 g/dL (ref 3.5–5.0)
Alkaline Phosphatase: 62 U/L (ref 38–126)
BUN: 19 mg/dL (ref 6–20)
CHLORIDE: 104 mmol/L (ref 101–111)
CO2: 27 mmol/L (ref 22–32)
CREATININE: 1 mg/dL (ref 0.44–1.00)
Calcium: 9.2 mg/dL (ref 8.9–10.3)
Glucose, Bld: 110 mg/dL — ABNORMAL HIGH (ref 65–99)
Potassium: 4.4 mmol/L (ref 3.5–5.1)
SODIUM: 141 mmol/L (ref 135–145)
Total Bilirubin: 0.5 mg/dL (ref 0.3–1.2)
Total Protein: 8.3 g/dL — ABNORMAL HIGH (ref 6.5–8.1)

## 2018-01-24 LAB — URINALYSIS, MICROSCOPIC (REFLEX)

## 2018-01-24 SURGERY — CYSTOSCOPY/RETROGRADE/URETEROSCOPY
Anesthesia: General | Laterality: Left

## 2018-01-24 MED ORDER — FENTANYL CITRATE (PF) 100 MCG/2ML IJ SOLN
INTRAMUSCULAR | Status: DC | PRN
  Administered 2018-01-24: 50 ug via INTRAVENOUS

## 2018-01-24 MED ORDER — HYDROCODONE-ACETAMINOPHEN 5-325 MG PO TABS
ORAL_TABLET | ORAL | Status: AC
  Filled 2018-01-24: qty 1

## 2018-01-24 MED ORDER — MIDAZOLAM HCL 5 MG/5ML IJ SOLN
INTRAMUSCULAR | Status: DC | PRN
  Administered 2018-01-24: 2 mg via INTRAVENOUS

## 2018-01-24 MED ORDER — FENTANYL CITRATE (PF) 100 MCG/2ML IJ SOLN
INTRAMUSCULAR | Status: AC
Start: 2018-01-24 — End: 2018-01-24
  Filled 2018-01-24: qty 2

## 2018-01-24 MED ORDER — MIDAZOLAM HCL 2 MG/2ML IJ SOLN
INTRAMUSCULAR | Status: AC
  Filled 2018-01-24: qty 2

## 2018-01-24 MED ORDER — KETOROLAC TROMETHAMINE 30 MG/ML IJ SOLN
INTRAMUSCULAR | Status: DC | PRN
  Administered 2018-01-24: 30 mg via INTRAVENOUS

## 2018-01-24 MED ORDER — ONDANSETRON HCL 4 MG/2ML IJ SOLN
INTRAMUSCULAR | Status: DC | PRN
  Administered 2018-01-24: 4 mg via INTRAVENOUS

## 2018-01-24 MED ORDER — LIDOCAINE 2% (20 MG/ML) 5 ML SYRINGE
INTRAMUSCULAR | Status: DC | PRN
  Administered 2018-01-24: 60 mg via INTRAVENOUS

## 2018-01-24 MED ORDER — HYDROCODONE-ACETAMINOPHEN 5-325 MG PO TABS
1.0000 | ORAL_TABLET | ORAL | Status: DC | PRN
  Administered 2018-01-24: 1 via ORAL
  Filled 2018-01-24: qty 1

## 2018-01-24 MED ORDER — ONDANSETRON HCL 4 MG/2ML IJ SOLN
INTRAMUSCULAR | Status: AC
Start: 2018-01-24 — End: 2018-01-24
  Filled 2018-01-24: qty 2

## 2018-01-24 MED ORDER — IOHEXOL 300 MG/ML  SOLN
INTRAMUSCULAR | Status: DC | PRN
  Administered 2018-01-24: 3 mL via URETHRAL

## 2018-01-24 MED ORDER — TAMSULOSIN HCL 0.4 MG PO CAPS
0.4000 mg | ORAL_CAPSULE | Freq: Every day | ORAL | Status: DC
  Administered 2018-01-24: 0.4 mg via ORAL
  Filled 2018-01-24: qty 1

## 2018-01-24 MED ORDER — PROMETHAZINE HCL 25 MG/ML IJ SOLN
6.2500 mg | INTRAMUSCULAR | Status: DC | PRN
  Filled 2018-01-24: qty 1

## 2018-01-24 MED ORDER — TAMSULOSIN HCL 0.4 MG PO CAPS: 0.4000 mg | ORAL_CAPSULE | Freq: Every day | ORAL | 0 refills | Status: DC

## 2018-01-24 MED ORDER — SODIUM CHLORIDE 0.9 % IR SOLN
Status: DC | PRN
  Administered 2018-01-24 (×2): 3000 mL via INTRAVESICAL

## 2018-01-24 MED ORDER — LACTATED RINGERS IV SOLN
INTRAVENOUS | Status: DC
  Administered 2018-01-24: 14:00:00 via INTRAVENOUS
  Filled 2018-01-24: qty 1000

## 2018-01-24 MED ORDER — KETOROLAC TROMETHAMINE 30 MG/ML IJ SOLN
INTRAMUSCULAR | Status: AC
  Filled 2018-01-24: qty 1

## 2018-01-24 MED ORDER — LIDOCAINE 2% (20 MG/ML) 5 ML SYRINGE
INTRAMUSCULAR | Status: AC
  Filled 2018-01-24: qty 5

## 2018-01-24 MED ORDER — DEXAMETHASONE SODIUM PHOSPHATE 4 MG/ML IJ SOLN
INTRAMUSCULAR | Status: DC | PRN
  Administered 2018-01-24: 10 mg via INTRAVENOUS

## 2018-01-24 MED ORDER — FENTANYL CITRATE (PF) 100 MCG/2ML IJ SOLN
25.0000 ug | INTRAMUSCULAR | Status: DC | PRN
  Administered 2018-01-24 (×2): 25 ug via INTRAVENOUS
  Filled 2018-01-24: qty 1

## 2018-01-24 MED ORDER — PROPOFOL 10 MG/ML IV BOLUS
INTRAVENOUS | Status: AC
  Filled 2018-01-24: qty 20

## 2018-01-24 MED ORDER — TAMSULOSIN HCL 0.4 MG PO CAPS
ORAL_CAPSULE | ORAL | Status: AC
  Filled 2018-01-24: qty 1

## 2018-01-24 MED ORDER — OXYCODONE-ACETAMINOPHEN 5-325 MG PO TABS: 1.0000 | ORAL_TABLET | ORAL | 0 refills | Status: DC | PRN

## 2018-01-24 MED ORDER — TRAMADOL HCL 50 MG PO TABS: 50.0000 mg | ORAL_TABLET | Freq: Four times a day (QID) | ORAL | 0 refills | Status: DC | PRN

## 2018-01-24 MED ORDER — CEFAZOLIN SODIUM-DEXTROSE 2-4 GM/100ML-% IV SOLN
INTRAVENOUS | Status: AC
Start: 2018-01-24 — End: 2018-01-24
  Filled 2018-01-24: qty 100

## 2018-01-24 MED ORDER — PROPOFOL 10 MG/ML IV BOLUS
INTRAVENOUS | Status: DC | PRN
  Administered 2018-01-24: 150 mg via INTRAVENOUS

## 2018-01-24 MED ORDER — DEXAMETHASONE SODIUM PHOSPHATE 10 MG/ML IJ SOLN
INTRAMUSCULAR | Status: AC
  Filled 2018-01-24: qty 1

## 2018-01-24 MED ORDER — CEFAZOLIN SODIUM-DEXTROSE 2-4 GM/100ML-% IV SOLN
2.0000 g | INTRAVENOUS | Status: AC
  Administered 2018-01-24: 2 g via INTRAVENOUS
  Filled 2018-01-24: qty 100

## 2018-01-24 MED ORDER — KETOROLAC TROMETHAMINE 30 MG/ML IJ SOLN
30.0000 mg | Freq: Once | INTRAMUSCULAR | Status: AC
  Administered 2018-01-24: 30 mg via INTRAVENOUS
  Filled 2018-01-24: qty 1

## 2018-01-24 MED ORDER — FENTANYL CITRATE (PF) 100 MCG/2ML IJ SOLN
INTRAMUSCULAR | Status: AC
  Filled 2018-01-24: qty 2

## 2018-01-24 MED FILL — TAMSULOSIN HCL 0.4 MG CAPS: 0.4 | 30 days supply | Qty: 30 | Fill #0

## 2018-01-24 MED FILL — traMADol HCL 50 MG TABS: 50 | 3 days supply | Qty: 15 | Fill #0

## 2018-01-24 SURGICAL SUPPLY — 30 items
BAG DRAIN URO-CYSTO SKYTR STRL (DRAIN) ×3 IMPLANT
BAG DRN UROCATH (DRAIN) ×1
CATH INTERMIT  6FR 70CM (CATHETERS) IMPLANT
CLOTH BEACON ORANGE TIMEOUT ST (SAFETY) ×3 IMPLANT
EVACUATOR MICROVAS BLADDER (UROLOGICAL SUPPLIES) IMPLANT
EXTRACTOR STONE 1.7FRX115CM (UROLOGICAL SUPPLIES) ×3 IMPLANT
FIBER LASER FLEXIVA 1000 (UROLOGICAL SUPPLIES) IMPLANT
FIBER LASER FLEXIVA 200 (UROLOGICAL SUPPLIES) IMPLANT
FIBER LASER FLEXIVA 365 (UROLOGICAL SUPPLIES) IMPLANT
FIBER LASER FLEXIVA 550 (UROLOGICAL SUPPLIES) IMPLANT
FIBER LASER TRAC TIP (UROLOGICAL SUPPLIES) IMPLANT
GLOVE BIO SURGEON STRL SZ8 (GLOVE) ×3 IMPLANT
GOWN STRL REUS W/TWL LRG LVL3 (GOWN DISPOSABLE) ×3 IMPLANT
GOWN STRL REUS W/TWL XL LVL3 (GOWN DISPOSABLE) ×3 IMPLANT
GUIDEWIRE ANG ZIPWIRE 038X150 (WIRE) ×3 IMPLANT
GUIDEWIRE STR DUAL SENSOR (WIRE) IMPLANT
INFUSOR MANOMETER BAG 3000ML (MISCELLANEOUS) ×3 IMPLANT
IV NS 1000ML (IV SOLUTION) ×3
IV NS 1000ML BAXH (IV SOLUTION) ×1 IMPLANT
IV NS IRRIG 3000ML ARTHROMATIC (IV SOLUTION) ×3 IMPLANT
KIT RM TURNOVER CYSTO AR (KITS) ×3 IMPLANT
MANIFOLD NEPTUNE II (INSTRUMENTS) ×3 IMPLANT
NS IRRIG 500ML POUR BTL (IV SOLUTION) ×3 IMPLANT
PACK CYSTO (CUSTOM PROCEDURE TRAY) ×3 IMPLANT
STENT URET 6FRX24 CONTOUR (STENTS) ×2 IMPLANT
STENT URET 6FRX26 CONTOUR (STENTS) IMPLANT
SYRINGE 10CC LL (SYRINGE) ×3 IMPLANT
TUBE CONNECTING 12'X1/4 (SUCTIONS)
TUBE CONNECTING 12X1/4 (SUCTIONS) IMPLANT
TUBE FEEDING 8FR 16IN STR KANG (MISCELLANEOUS) IMPLANT

## 2018-01-24 NOTE — H&P (View-Only) (Signed)
Urology Admission H&P  Chief Complaint: left flank pain  History of Present Illness: Tara Blevins is a 51yo with a hx of nephrolithiasis who developed severe sharp, constant, nonraditing left flank pain 3 days ago. She now has gross hematuria and intractable nausea. CT shows a 9mm left proximal ureteral calculus. No fevers/chills/sweats.  Past Medical History:  Diagnosis Date  . Back pain   . Diabetes mellitus without complication (HCC)   . High cholesterol   . Hypertension   . Kidney stone   . kidney stones    Past Surgical History:  Procedure Laterality Date  . ABDOMINAL HYSTERECTOMY    . ADENOIDECTOMY    . BACK SURGERY    . OOPHORECTOMY    . TONSILLECTOMY      Home Medications:  Current Facility-Administered Medications  Medication Dose Route Frequency Provider Last Rate Last Dose  . ceFAZolin (ANCEF) IVPB 2g/100 mL premix  2 g Intravenous 30 min Pre-Op McKenzie, Mardene CelestePatrick L, MD      . lactated ringers infusion   Intravenous Continuous Marcene DuosFitzgerald, Robert, MD 50 mL/hr at 01/24/18 1350     Allergies:  Allergies  Allergen Reactions  . Tomato     Family History  Problem Relation Age of Onset  . Heart disease Father    Social History:  reports that  has never smoked. she has never used smokeless tobacco. She reports that she does not drink alcohol or use drugs.  Review of Systems  Genitourinary: Positive for flank pain and hematuria.  All other systems reviewed and are negative.   Physical Exam:  Vital signs in last 24 hours: Temp:  [97.9 F (36.6 C)-98.1 F (36.7 C)] 98.1 F (36.7 C) (02/07 1232) Pulse Rate:  [55-69] 55 (02/07 1232) Resp:  [16-18] 16 (02/07 1232) BP: (118-132)/(65-86) 118/65 (02/07 1232) SpO2:  [100 %] 100 % (02/07 1232) Weight:  [78 kg (172 lb)-79.4 kg (175 lb)] 78 kg (172 lb) (02/07 1232) Physical Exam  Constitutional: She is oriented to person, place, and time. She appears well-developed and well-nourished.  HENT:  Head: Normocephalic and  atraumatic.  Eyes: EOM are normal. Pupils are equal, round, and reactive to light.  Neck: Normal range of motion. No thyromegaly present.  Cardiovascular: Normal rate and regular rhythm.  Respiratory: Effort normal. No respiratory distress.  GI: Soft. She exhibits no distension.  Musculoskeletal: Normal range of motion. She exhibits no edema.  Neurological: She is alert and oriented to person, place, and time.  Skin: Skin is warm and dry.  Psychiatric: She has a normal mood and affect. Her behavior is normal. Judgment and thought content normal.    Laboratory Data:  Results for orders placed or performed during the hospital encounter of 01/24/18 (from the past 24 hour(s))  Glucose, capillary     Status: None   Collection Time: 01/24/18  1:50 PM  Result Value Ref Range   Glucose-Capillary 86 65 - 99 mg/dL   Recent Results (from the past 240 hour(s))  Urine culture     Status: None   Collection Time: 01/21/18 11:59 AM  Result Value Ref Range Status   Specimen Description   Final    URINE, RANDOM Performed at [facility], [address] [place], [place] [address]    Special Requests   Final    NONE Performed at [facility], [address] [place], [place] [address]    Culture   Final    NO GROWTH Performed at [facility], [address] [place], [place] [address]    Report Status 01/23/2018 FINAL  Final   Creatinine: Recent Labs    01/21/18 1420 01/24/18 0737  CREATININE 0.90 1.00   Baseline Creatinine: 0.9  Impression/Assessment:  51yo with left proximal ureteral calculus  Plan:  The risks/benefits/alternatives to left ureteroscopic stone extraction was explained to the patient and she understands and wishes to proceed with surgery  Wilkie Aye 01/24/2018, 3:08 PM

## 2018-01-24 NOTE — H&P (Signed)
Urology Admission H&P  Chief Complaint: left flank pain  History of Present Illness: Ms Tara Blevins is a 51yo with a hx of nephrolithiasis who developed severe sharp, constant, nonraditing left flank pain 3 days ago. She now has gross hematuria and intractable nausea. CT shows a 9mm left proximal ureteral calculus. No fevers/chills/sweats.  Past Medical History:  Diagnosis Date  . Back pain   . Diabetes mellitus without complication (HCC)   . High cholesterol   . Hypertension   . Kidney stone   . kidney stones    Past Surgical History:  Procedure Laterality Date  . ABDOMINAL HYSTERECTOMY    . ADENOIDECTOMY    . BACK SURGERY    . OOPHORECTOMY    . TONSILLECTOMY      Home Medications:  Current Facility-Administered Medications  Medication Dose Route Frequency Provider Last Rate Last Dose  . ceFAZolin (ANCEF) IVPB 2g/100 mL premix  2 g Intravenous 30 min Pre-Op Ryu Cerreta, Mardene CelestePatrick L, MD      . lactated ringers infusion   Intravenous Continuous Marcene DuosFitzgerald, Robert, MD 50 mL/hr at 01/24/18 1350     Allergies:  Allergies  Allergen Reactions  . Tomato     Family History  Problem Relation Age of Onset  . Heart disease Father    Social History:  reports that  has never smoked. she has never used smokeless tobacco. She reports that she does not drink alcohol or use drugs.  Review of Systems  Genitourinary: Positive for flank pain and hematuria.  All other systems reviewed and are negative.   Physical Exam:  Vital signs in last 24 hours: Temp:  [97.9 F (36.6 C)-98.1 F (36.7 C)] 98.1 F (36.7 C) (02/07 1232) Pulse Rate:  [55-69] 55 (02/07 1232) Resp:  [16-18] 16 (02/07 1232) BP: (118-132)/(65-86) 118/65 (02/07 1232) SpO2:  [100 %] 100 % (02/07 1232) Weight:  [78 kg (172 lb)-79.4 kg (175 lb)] 78 kg (172 lb) (02/07 1232) Physical Exam  Constitutional: She is oriented to person, place, and time. She appears well-developed and well-nourished.  HENT:  Head: Normocephalic and  atraumatic.  Eyes: EOM are normal. Pupils are equal, round, and reactive to light.  Neck: Normal range of motion. No thyromegaly present.  Cardiovascular: Normal rate and regular rhythm.  Respiratory: Effort normal. No respiratory distress.  GI: Soft. She exhibits no distension.  Musculoskeletal: Normal range of motion. She exhibits no edema.  Neurological: She is alert and oriented to person, place, and time.  Skin: Skin is warm and dry.  Psychiatric: She has a normal mood and affect. Her behavior is normal. Judgment and thought content normal.    Laboratory Data:  Results for orders placed or performed during the hospital encounter of 01/24/18 (from the past 24 hour(s))  Glucose, capillary     Status: None   Collection Time: 01/24/18  1:50 PM  Result Value Ref Range   Glucose-Capillary 86 65 - 99 mg/dL   Recent Results (from the past 240 hour(s))  Urine culture     Status: None   Collection Time: 01/21/18 11:59 AM  Result Value Ref Range Status   Specimen Description   Final    URINE, RANDOM Performed at [facility], [address] [place], [place] [address]    Special Requests   Final    NONE Performed at [facility], [address] [place], [place] [address]    Culture   Final    NO GROWTH Performed at [facility], [address] [place], [place] [address]    Report Status 01/23/2018 FINAL  Final   Creatinine: Recent Labs    01/21/18 1420 01/24/18 0737  CREATININE 0.90 1.00   Baseline Creatinine: 0.9  Impression/Assessment:  51yo with left proximal ureteral calculus  Plan:  The risks/benefits/alternatives to left ureteroscopic stone extraction was explained to the patient and she understands and wishes to proceed with surgery  Wilkie Aye 01/24/2018, 3:08 PM

## 2018-01-24 NOTE — Transfer of Care (Signed)
  Last Vitals:  Vitals:   01/24/18 1232  BP: 118/65  Pulse: (!) 55  Resp: 16  Temp: 36.7 C  SpO2: 100%    Last Pain:  Vitals:   01/24/18 1246  TempSrc:   PainSc: 5       Patients Stated Pain Goal: 0 (01/24/18 1246)  Immediate Anesthesia Transfer of Care Note  Patient: Tara Blevins  Procedure(s) Performed: Procedure(s) (LRB): CYSTOSCOPY/LEFT RETROGRADE/LEFT URETEROSCOPY AND LEFT STENT PLACEMENT (Left)  Patient Location: PACU  Anesthesia Type: General  Level of Consciousness: awake, alert  and oriented  Airway & Oxygen Therapy: Patient Spontanous Breathing and Patient connected to nasal cannula oxygen  Post-op Assessment: Report given to PACU RN and Post -op Vital signs reviewed and stable  Post vital signs: Reviewed and stable  Complications: No apparent anesthesia complications

## 2018-01-24 NOTE — Op Note (Signed)
.  Preoperative diagnosis: Left proximal ureteral stone  Postoperative diagnosis: Same  Procedure: 1 cystoscopy 2. Left retrograde pyelography 3.  Intraoperative fluoroscopy, under one hour, with interpretation 4.  Left diagnostic ureteroscopy 5.  Left 6 x 24 JJ stent placement  Attending: Wilkie AyePatrick Dwain Huhn  Anesthesia: General  Estimated blood loss: None  Drains: Left 6 x 24 JJ ureteral stent without tether  Specimens: none  Antibiotics: ancef  Findings: left proximal ureteral stone. Moderate hydronephrosis. No masses/lesions in the bladder. Ureteral orifices in normal anatomic location.  Indications: Patient is a 52 year old female with a history of left ureteral stone and intractable pain and nausea.  After discussing treatment options, they decided proceed with left ureteroscopic stone extraction.  Procedure in detail: The patient was brought to the operating room and a brief timeout was done to ensure correct patient, correct procedure, correct site.  General anesthesia was administered patient was placed in dorsal lithotomy position.  Their genitalia was then prepped and draped in usual sterile fashion.  A rigid 22 French cystoscope was passed in the urethra and the bladder.  Bladder was inspected free masses or lesions.  the ureteral orifices were in the normal orthotopic locations.  a 6 french ureteral catheter was then instilled into the left ureteral orifice.  a gentle retrograde was obtained and findings noted above.  we then placed a zip wire through the ureteral catheter and advanced up to the renal pelvis.   We removed the cystoscope and cannulated the left ureter with the semirigid ureteroscope. We advanced the scope up to the proximal ureter but due to the small caliber of the left ureter we were unable to advance to the renal pelvis. We elected to place a stent. We removed the ureteroscope. We then placed a 6 x 26 double-j ureteral stent over the original zip wire.  We then  removed the wire and good coil was noted in the the renal pelvis under fluoroscopy and the bladder under direct vision.  A foley catheter was then placed. the bladder was then drained and this concluded the procedure which was well tolerated by patient.  Complications: None  Condition: Stable, extubated, transferred to PACU  Plan: Patient is to be discharged home and followup in 2 weeks for stone extraction.

## 2018-01-24 NOTE — ED Triage Notes (Signed)
Blood in urine and LLQ pain continues since being seen here 3 days ago.  Has been taking the abx as prescribed but is no better.

## 2018-01-24 NOTE — ED Notes (Signed)
ED Provider at bedside. 

## 2018-01-24 NOTE — Discharge Instructions (Signed)
NO ADVIL , ALEVE, MOTRIN, IBUPROFEN UNTIL 9:30 TONIGHT    Ureteral Stent Implantation, Care After Refer to this sheet in the next few weeks. These instructions provide you with information about caring for yourself after your procedure. Your health care provider may also give you more specific instructions. Your treatment has been planned according to current medical practices, but problems sometimes occur. Call your health care provider if you have any problems or questions after your procedure. What can I expect after the procedure? After the procedure, it is common to have:  Nausea.  Mild pain when you urinate. You may feel this pain in your lower back or lower abdomen. Pain should stop within a few minutes after you urinate. This may last for up to 1 week.  A small amount of blood in your urine for several days.  Follow these instructions at home:  Medicines  Take over-the-counter and prescription medicines only as told by your health care provider.  If you were prescribed an antibiotic medicine, take it as told by your health care provider. Do not stop taking the antibiotic even if you start to feel better.  Do not drive for 24 hours if you received a sedative.  Do not drive or operate heavy machinery while taking prescription pain medicines. Activity  Return to your normal activities as told by your health care provider. Ask your health care provider what activities are safe for you.  Do not lift anything that is heavier than 10 lb (4.5 kg). Follow this limit for 1 week after your procedure, or for as long as told by your health care provider. General instructions  Watch for any blood in your urine. Call your health care provider if the amount of blood in your urine increases.  If you have a catheter: ? Follow instructions from your health care provider about taking care of your catheter and collection bag. ? Do not take baths, swim, or use a hot tub until your health care  provider approves.  Drink enough fluid to keep your urine clear or pale yellow.  Keep all follow-up visits as told by your health care provider. This is important. Contact a health care provider if:  You have pain that gets worse or does not get better with medicine, especially pain when you urinate.  You have difficulty urinating.  You feel nauseous or you vomit repeatedly during a period of more than 2 days after the procedure. Get help right away if:  Your urine is dark red or has blood clots in it.  You are leaking urine (have incontinence).  The end of the stent comes out of your urethra.  You cannot urinate.  You have sudden, sharp, or severe pain in your abdomen or lower back.  You have a fever. This information is not intended to replace advice given to you by your health care provider. Make sure you discuss any questions you have with your health care provider. Document Released: 08/06/2013 Document Revised: 05/11/2016 Document Reviewed: 06/18/2015 Elsevier Interactive Patient Education  2018 [employer]   Post Anesthesia Home Care Instructions  Activity: Get plenty of rest for the remainder of the day. A responsible individual must stay with you for 24 hours following the procedure.  For the next 24 hours, DO NOT: -Drive a car -Advertising copywriterperate machinery -Drink alcoholic beverages -Take any medication unless instructed by your physician -Make any legal decisions or sign important papers.  Meals: Start with liquid foods such as gelatin or soup. Progress  to regular foods as tolerated. Avoid greasy, spicy, heavy foods. If nausea and/or vomiting occur, drink only clear liquids until the nausea and/or vomiting subsides. Call your physician if vomiting continues.  Special Instructions/Symptoms: Your throat may feel dry or sore from the anesthesia or the breathing tube placed in your throat during surgery. If this causes discomfort, gargle with warm salt water. The  discomfort should disappear within 24 hours.  If you had a scopolamine patch placed behind your ear for the management of post- operative nausea and/or vomiting:  1. The medication in the patch is effective for 72 hours, after which it should be removed.  Wrap patch in a tissue and discard in the trash. Wash hands thoroughly with soap and water. 2. You may remove the patch earlier than 72 hours if you experience unpleasant side effects which may include dry mouth, dizziness or visual disturbances. 3. Avoid touching the patch. Wash your hands with soap and water after contact with the patch.

## 2018-01-24 NOTE — ED Provider Notes (Signed)
Emergency Department Provider Note   I have reviewed the triage vital signs and the nursing notes.   HISTORY  Chief Complaint Hematuria   HPI Tara Blevins is a 52 y.o. female with PMH of DM, HTN, and prior kidney stones presents to the emergency department for evaluation of continued left lower quadrant and flank pain with hematuria.  Patient was seen in the emergency department 3 days ago with similar symptoms.  At that time she was found to have a nonobstructing 9 mm kidney stone in the renal pelvis some associated pyelitis.  She received Rocephin and was discharged home on Keflex.  Patient states her symptoms have not improved on this therapy.  She denies any fevers or chills.  No vaginal bleeding or discharge.  Denies any dysuria but continues to have hematuria.  She denies any diarrhea, pain with bowel movements, vomiting, or blood in the stool.  She does have a urologist but does not follow with them since she required intervention on prior kidney stone several years ago. Pain is intermittent and severe.    Past Medical History:  Diagnosis Date  . Back pain   . Diabetes mellitus without complication (HCC)   . High cholesterol   . Hypertension   . Kidney stone   . kidney stones     There are no active problems to display for this patient.   Past Surgical History:  Procedure Laterality Date  . ABDOMINAL HYSTERECTOMY    . ADENOIDECTOMY    . BACK SURGERY    . OOPHORECTOMY    . TONSILLECTOMY      Current Outpatient Rx  . Order #: [id number] Class: Historical Med  . Order #: [id number] Class: Historical Med  . Order #: [id number] Class: Historical Med  . Order #: [id number] Class: Historical Med  . Order #: [id number] Class: Print  . Order #: [id number] Class: Print  . Order #: [id number] Class: Print  . Order #: [id number] Class: Print  . Order #: [id number] Class: Print  . Order #: [id number] Class: Historical Med    Allergies Tomato  Family History  Problem Relation Age of  Onset  . Heart disease Father     Social History Social History   Tobacco Use  . Smoking status: Never Smoker  . Smokeless tobacco: Never Used  Substance Use Topics  . Alcohol use: No  . Drug use: No    Review of Systems  Constitutional: No fever/chills Eyes: No visual changes. ENT: No sore throat. Cardiovascular: Denies chest pain. Respiratory: Denies shortness of breath. Gastrointestinal: Positive LLQ abdominal pain.  No nausea, no vomiting.  No diarrhea.  No constipation. Genitourinary: Negative for dysuria. Positive hematuria.  Musculoskeletal: Negative for back pain. Skin: Negative for rash. Neurological: Negative for headaches, focal weakness or numbness.  10-point ROS otherwise negative.  ____________________________________________   PHYSICAL EXAM:  VITAL SIGNS: ED Triage Vitals  Enc Vitals Group     BP 01/24/18 0712 132/86     Pulse Rate 01/24/18 0712 69     Resp 01/24/18 0712 18     Temp 01/24/18 0712 97.9 F (36.6 C)     Temp Source 01/24/18 0712 Oral     SpO2 01/24/18 0712 100 %     Weight 01/24/18 0712 175 lb (79.4 kg)     Height 01/24/18 0712 5' (1.524 m)     Pain Score 01/24/18 0717 8   Constitutional: Alert and oriented. Well appearing but appears slightly uncomfortable.  Eyes: Conjunctivae are normal. Head: Atraumatic. Nose: No congestion/rhinnorhea. Mouth/Throat: Mucous membranes  are moist.  Neck: No stridor.  Cardiovascular: Normal rate, regular rhythm. Good peripheral circulation. Grossly normal heart sounds.   Respiratory: Normal respiratory effort.  No retractions. Lungs CTAB. Gastrointestinal: Soft with mild LLQ tenderness to palpation. No rebound or guarding. No distention.  Musculoskeletal: No lower extremity tenderness nor edema. No gross deformities of extremities. Neurologic:  Normal speech and language. No gross focal neurologic deficits are appreciated.  Skin:  Skin is warm, dry and intact. No rash  noted.  ____________________________________________   LABS (all labs ordered are listed, but only abnormal results are displayed)  Labs Reviewed  COMPREHENSIVE METABOLIC PANEL - Abnormal; Notable for the following components:      Result Value   Glucose, Bld 110 (*)    Total Protein 8.3 (*)    All other components within normal limits  URINALYSIS, ROUTINE W REFLEX MICROSCOPIC - Abnormal; Notable for the following components:   APPearance CLOUDY (*)    Specific Gravity, Urine >1.030 (*)    Hgb urine dipstick LARGE (*)    Protein, ur 100 (*)    Leukocytes, UA TRACE (*)    All other components within normal limits  URINALYSIS, MICROSCOPIC (REFLEX) - Abnormal; Notable for the following components:   Bacteria, UA FEW (*)    Squamous Epithelial / LPF 0-5 (*)    All other components within normal limits  URINE CULTURE  CBC WITH DIFFERENTIAL/PLATELET   ____________________________________________  RADIOLOGY  Dg Abdomen 1 View  Result Date: 01/24/2018 CLINICAL DATA:  Left renal stone EXAM: ABDOMEN - 1 VIEW COMPARISON:  CT 01/21/2018 FINDINGS: Calcification again noted projecting medial to the left kidney, likely renal pelvic/UPJ stone as seen on prior CT. Small calcification projects over the lower pole and midpole of the right kidney. Moderate stool in the colon. Oral contrast material in the left side of the colon. No evidence of bowel obstruction or free air. IMPRESSION: Left renal pelvic stone likely unchanged. Right nephrolithiasis. Moderate stool burden in the colon. Electronically Signed   By: Charlett Nose M.D.   On: 01/24/2018 08:36    ____________________________________________   PROCEDURES  Procedure(s) performed:   Procedures  None ____________________________________________   INITIAL IMPRESSION / ASSESSMENT AND PLAN / ED COURSE  Pertinent labs & imaging results that were available during my care of the patient were reviewed by me and considered in my medical  decision making (see chart for details).  Patient presents to the emergency department with continued left lower quadrant pain and hematuria.  CT scan from her prior ED visit 3 days ago shows a 9 mm nonobstructing stone in the left renal pelvis with some enhancement of that area and proximal ureter thought to be from infectious versus inflammatory pyelitis.  Patient is afebrile.  Reviewed labs from her last ED visit which are largely unremarkable with the exception of hematuria on UA.  She has not been experiencing fever flulike symptoms at home.  Compliant with antibiotics.  For KUB to assess for movement of stone to be causing increased symptoms.  Will repeat repeat urinalysis and lab evaluation.  Treating pain with Toradol.  09:07 AM  labs reviewed along with imaging.  No sign of infection.  We will have the patient continue antibiotics.  I spoke with Dr. Thea Silversmith with [facility].  He can see her in the office today and plan for lithotripsy on Monday.   At this time, I do not feel there is any life-threatening condition present. I have reviewed and discussed all results (EKG,  imaging, lab, urine as appropriate), exam findings with patient. I have reviewed nursing notes and appropriate previous records.  I feel the patient is safe to be discharged home without further emergent workup. Discussed usual and customary return precautions. Patient and family (if present) verbalize understanding and are comfortable with this plan.  Patient will follow-up with their primary care provider. If they do not have a primary care provider, information for follow-up has been provided to them. All questions have been answered.  ____________________________________________  FINAL CLINICAL IMPRESSION(S) / ED DIAGNOSES  Final diagnoses:  Hematuria, unspecified type     MEDICATIONS GIVEN DURING THIS VISIT:  Medications  ketorolac (TORADOL) 30 MG/ML injection 30 mg (30 mg Intravenous Given 01/24/18 0746)      NEW OUTPATIENT MEDICATIONS STARTED DURING THIS VISIT:  New Prescriptions   TAMSULOSIN (FLOMAX) 0.4 MG CAPS CAPSULE    Take 1 capsule (0.4 mg total) by mouth daily.   TRAMADOL (ULTRAM) 50 MG TABLET    Take 1 tablet (50 mg total) by mouth every 6 (six) hours as needed.    Note:  This document was prepared using Dragon voice recognition software and may include unintentional dictation errors.  Alona Bene, MD Emergency Medicine    Zaray Gatchel, Arlyss Repress, MD 01/24/18 [phone]

## 2018-01-24 NOTE — Anesthesia Preprocedure Evaluation (Addendum)
Anesthesia Evaluation  Patient identified by MRN, date of birth, ID band Patient awake    Reviewed: Allergy & Precautions, NPO status , Patient's Chart, lab work & pertinent test results  Airway Mallampati: II  TM Distance: >3 FB Neck ROM: Full    Dental  (+) Dental Advisory Given   Pulmonary neg pulmonary ROS,    breath sounds clear to auscultation       Cardiovascular hypertension, Pt. on medications  Rhythm:Regular Rate:Normal     Neuro/Psych negative neurological ROS     GI/Hepatic negative GI ROS, Neg liver ROS,   Endo/Other  diabetes, Type 2, Oral Hypoglycemic Agents  Renal/GU Renal disease     Musculoskeletal   Abdominal   Peds  Hematology negative hematology ROS (+)   Anesthesia Other Findings   Reproductive/Obstetrics                             Anesthesia Physical Anesthesia Plan  ASA: II  Anesthesia Plan: General   Post-op Pain Management:    Induction: Intravenous  PONV Risk Score and Plan: 3 and Ondansetron, Dexamethasone, Midazolam and Treatment may vary due to age or medical condition  Airway Management Planned: LMA  Additional Equipment:   Intra-op Plan:   Post-operative Plan: Extubation in OR  Informed Consent: I have reviewed the patients History and Physical, chart, labs and discussed the procedure including the risks, benefits and alternatives for the proposed anesthesia with the patient or authorized representative who has indicated his/her understanding and acceptance.   Dental advisory given  Plan Discussed with: CRNA  Anesthesia Plan Comments:         Anesthesia Quick Evaluation

## 2018-01-24 NOTE — Anesthesia Procedure Notes (Signed)
Procedure Name: LMA Insertion Date/Time: 01/24/2018 3:19 PM Performed by: Lewie LoronGermeroth, John, MD Pre-anesthesia Checklist: Patient identified, Emergency Drugs available, Suction available and Patient being monitored Patient Re-evaluated:Patient Re-evaluated prior to induction Oxygen Delivery Method: Circle system utilized Preoxygenation: Pre-oxygenation with 100% oxygen Induction Type: IV induction Ventilation: Mask ventilation without difficulty LMA: LMA inserted LMA Size: 4.0 Number of attempts: 1 Airway Equipment and Method: Bite block Placement Confirmation: positive ETCO2 Tube secured with: Tape Dental Injury: Teeth and Oropharynx as per pre-operative assessment

## 2018-01-24 NOTE — Anesthesia Postprocedure Evaluation (Signed)
Anesthesia Post Note  Patient: Tara Blevins  Procedure(s) Performed: CYSTOSCOPY/LEFT RETROGRADE/LEFT URETEROSCOPY AND LEFT STENT PLACEMENT (Left )     Patient location during evaluation: PACU Anesthesia Type: General Level of consciousness: sedated and patient cooperative Pain management: pain level controlled Vital Signs Assessment: post-procedure vital signs reviewed and stable Respiratory status: spontaneous breathing Cardiovascular status: stable Anesthetic complications: no    Last Vitals:  Vitals:   01/24/18 1615 01/24/18 1630  BP: 126/77 129/64  Pulse: (!) 58 (!) 56  Resp: 14 12  Temp:    SpO2: 99% 100%    Last Pain:  Vitals:   01/24/18 1730  TempSrc:   PainSc: 6                  Lewie LoronJohn Braiden Presutti

## 2018-01-24 NOTE — Discharge Instructions (Signed)
You have been seen in the Emergency Department (ED) today for pain that we believe based on your workup, is caused by kidney stones.  As we have discussed, please drink plenty of fluids.  Please make a follow up appointment with the physician(s) listed elsewhere in this documentation. ° °You may take pain medication as needed but ONLY as prescribed.  Please also take your prescribed Flomax daily.  We also recommend that you take over-the-counter ibuprofen regularly according to label instructions over the next 5 days.  Take it with meals to minimize stomach discomfort. ° °Please see your doctor as soon as possible as stones may take 1-3 weeks to pass and you may require additional care or medications. ° °Do not drink alcohol, drive or participate in any other potentially dangerous activities while taking opiate pain medication as it may make you sleepy. Do not take this medication with any other sedating medications, either prescription or over-the-counter. If you were prescribed Percocet or Vicodin, do not take these with acetaminophen (Tylenol) as it is already contained within these medications. °  °Take Tramadol as needed for severe pain.  This medication is an opiate (or narcotic) pain medication and can be habit forming.  Use it as little as possible to achieve adequate pain control.  Do not use or use it with extreme caution if you have a history of opiate abuse or dependence.  If you are on a pain contract with your primary care doctor or a pain specialist, be sure to let them know you were prescribed this medication today from the Emergency Department.  This medication is intended for your use only - do not give any to anyone else and keep it in a secure place where nobody else, especially children, have access to it.  It will also cause or worsen constipation, so you may want to consider taking an over-the-counter stool softener while you are taking this medication. ° °Return to the Emergency Department  (ED) or call your doctor if you have any worsening pain, fever, painful urination, are unable to urinate, or develop other symptoms that concern you. ° ° ° °Kidney Stones °Kidney stones (urolithiasis) are deposits that form inside your kidneys. The intense pain is caused by the stone moving through the urinary tract. When the stone moves, the ureter goes into spasm around the stone. The stone is usually passed in the urine.  °CAUSES  °A disorder that makes certain neck glands produce too much parathyroid hormone (primary hyperparathyroidism). °A buildup of uric acid crystals, similar to gout in your joints. °Narrowing (stricture) of the ureter. °A kidney obstruction present at birth (congenital obstruction). °Previous surgery on the kidney or ureters. °Numerous kidney infections. °SYMPTOMS  °Feeling sick to your stomach (nauseous). °Throwing up (vomiting). °Blood in the urine (hematuria). °Pain that usually spreads (radiates) to the groin. °Frequency or urgency of urination. °DIAGNOSIS  °Taking a history and physical exam. °Blood or urine tests. °CT scan. °Occasionally, an examination of the inside of the urinary bladder (cystoscopy) is performed. °TREATMENT  °Observation. °Increasing your fluid intake. °Extracorporeal shock wave lithotripsy--This is a noninvasive procedure that uses shock waves to break up kidney stones. °Surgery may be needed if you have severe pain or persistent obstruction. There are various surgical procedures. Most of the procedures are performed with the use of small instruments. Only small incisions are needed to accommodate these instruments, so recovery time is minimized. °The size, location, and chemical composition are all important variables that will determine the proper   choice of action for you. Talk to your health care provider to better understand your situation so that you will minimize the risk of injury to yourself and your kidney.  °HOME CARE INSTRUCTIONS  °Drink enough water  and fluids to keep your urine clear or pale yellow. This will help you to pass the stone or stone fragments. °Strain all urine through the provided strainer. Keep all particulate matter and stones for your health care provider to see. The stone causing the pain may be as small as a grain of salt. It is very important to use the strainer each and every time you pass your urine. The collection of your stone will allow your health care provider to analyze it and verify that a stone has actually passed. The stone analysis will often identify what you can do to reduce the incidence of recurrences. °Only take over-the-counter or prescription medicines for pain, discomfort, or fever as directed by your health care provider. °Keep all follow-up visits as told by your health care provider. This is important. °Get follow-up X-rays if required. The absence of pain does not always mean that the stone has passed. It may have only stopped moving. If the urine remains completely obstructed, it can cause loss of kidney function or even complete destruction of the kidney. It is your responsibility to make sure X-rays and follow-ups are completed. Ultrasounds of the kidney can show blockages and the status of the kidney. Ultrasounds are not associated with any radiation and can be performed easily in a matter of minutes. °Make changes to your daily diet as told by your health care provider. You may be told to: °Limit the amount of salt that you eat. °Eat 5 or more servings of fruits and vegetables each day. °Limit the amount of meat, poultry, fish, and eggs that you eat. °Collect a 24-hour urine sample as told by your health care provider. You may need to collect another urine sample every 6-12 months. °SEEK MEDICAL CARE IF: °You experience pain that is progressive and unresponsive to any pain medicine you have been prescribed. °SEEK IMMEDIATE MEDICAL CARE IF:  °Pain cannot be controlled with the prescribed medicine. °You have a  fever or shaking chills. °The severity or intensity of pain increases over 18 hours and is not relieved by pain medicine. °You develop a new onset of abdominal pain. °You feel faint or pass out. °You are unable to urinate. °  °This information is not intended to replace advice given to you by your health care provider. Make sure you discuss any questions you have with your health care provider. °  °Document Released: 12/04/2005 Document Revised: 08/25/2015 Document Reviewed: 05/07/2013 °Elsevier Interactive Patient Education ©2016 [employer] ° ° ° °

## 2018-01-25 ENCOUNTER — Encounter (HOSPITAL_BASED_OUTPATIENT_CLINIC_OR_DEPARTMENT_OTHER): Payer: Self-pay | Admitting: Urology

## 2018-01-25 LAB — URINE CULTURE
CULTURE: NO GROWTH
SPECIAL REQUESTS: NORMAL

## 2018-01-30 ENCOUNTER — Other Ambulatory Visit: Payer: Self-pay | Admitting: Urology

## 2018-01-30 ENCOUNTER — Encounter (HOSPITAL_BASED_OUTPATIENT_CLINIC_OR_DEPARTMENT_OTHER): Payer: Self-pay

## 2018-01-30 ENCOUNTER — Other Ambulatory Visit: Payer: Self-pay

## 2018-01-30 NOTE — Progress Notes (Signed)
Spoke with:  Daylani NPO:  After Midnight, no gum, candy, or mints   Arrival time: 1610RU0630AM Labs: Istat8, EKG AM medications: Tamsulosin Pre op orders: Yes Ride home:  Sharyl NimrodCedric (boyfriend) [phone]

## 2018-02-06 NOTE — Anesthesia Preprocedure Evaluation (Signed)
Anesthesia Evaluation  Patient identified by MRN, date of birth, ID band Patient awake    Reviewed: Allergy & Precautions, NPO status , Patient's Chart, lab work & pertinent test results  Airway Mallampati: II  TM Distance: >3 FB Neck ROM: Full    Dental  (+) Dental Advisory Given   Pulmonary neg pulmonary ROS,    breath sounds clear to auscultation       Cardiovascular hypertension, Pt. on medications  Rhythm:Regular Rate:Normal     Neuro/Psych negative neurological ROS     GI/Hepatic negative GI ROS, Neg liver ROS,   Endo/Other  diabetes, Type 2, Oral Hypoglycemic AgentsObesity  Renal/GU Nephrolithiasis     Musculoskeletal  (+) Arthritis ,   Abdominal   Peds  Hematology negative hematology ROS (+)   Anesthesia Other Findings   Reproductive/Obstetrics                             Anesthesia Physical  Anesthesia Plan  ASA: II  Anesthesia Plan: General   Post-op Pain Management:    Induction: Intravenous  PONV Risk Score and Plan: 3 and Ondansetron, Dexamethasone, Midazolam and Treatment may vary due to age or medical condition  Airway Management Planned: LMA  Additional Equipment: None  Intra-op Plan:   Post-operative Plan: Extubation in OR  Informed Consent: I have reviewed the patients History and Physical, chart, labs and discussed the procedure including the risks, benefits and alternatives for the proposed anesthesia with the patient or authorized representative who has indicated his/her understanding and acceptance.   Dental advisory given  Plan Discussed with: CRNA  Anesthesia Plan Comments:         Anesthesia Quick Evaluation

## 2018-02-07 ENCOUNTER — Ambulatory Visit (HOSPITAL_BASED_OUTPATIENT_CLINIC_OR_DEPARTMENT_OTHER): Payer: Self-pay | Admitting: Anesthesiology

## 2018-02-07 ENCOUNTER — Encounter (HOSPITAL_BASED_OUTPATIENT_CLINIC_OR_DEPARTMENT_OTHER): Payer: Self-pay | Admitting: *Deleted

## 2018-02-07 ENCOUNTER — Other Ambulatory Visit: Payer: Self-pay

## 2018-02-07 ENCOUNTER — Encounter (HOSPITAL_BASED_OUTPATIENT_CLINIC_OR_DEPARTMENT_OTHER)
Admission: RE | Disposition: A | Discharge status: Home / Self Care | Payer: Self-pay | Source: Ambulatory Visit | Attending: Urology

## 2018-02-07 ENCOUNTER — Ambulatory Visit (HOSPITAL_BASED_OUTPATIENT_CLINIC_OR_DEPARTMENT_OTHER)
Admission: RE | Admit: 2018-02-07 | Discharge: 2018-02-07 | Disposition: A | Discharge status: Home / Self Care | Payer: Self-pay | Source: Ambulatory Visit | Attending: Urology | Admitting: Urology

## 2018-02-07 DIAGNOSIS — I10 Essential (primary) hypertension: Secondary | ICD-10-CM | POA: Insufficient documentation

## 2018-02-07 DIAGNOSIS — Z7982 Long term (current) use of aspirin: Secondary | ICD-10-CM | POA: Insufficient documentation

## 2018-02-07 DIAGNOSIS — Z79899 Other long term (current) drug therapy: Secondary | ICD-10-CM | POA: Insufficient documentation

## 2018-02-07 DIAGNOSIS — N201 Calculus of ureter: Secondary | ICD-10-CM | POA: Insufficient documentation

## 2018-02-07 DIAGNOSIS — E119 Type 2 diabetes mellitus without complications: Secondary | ICD-10-CM | POA: Insufficient documentation

## 2018-02-07 DIAGNOSIS — Z7984 Long term (current) use of oral hypoglycemic drugs: Secondary | ICD-10-CM | POA: Insufficient documentation

## 2018-02-07 DIAGNOSIS — Z87442 Personal history of urinary calculi: Secondary | ICD-10-CM | POA: Insufficient documentation

## 2018-02-07 HISTORY — PX: CYSTOSCOPY WITH RETROGRADE PYELOGRAM, URETEROSCOPY AND STENT PLACEMENT: SHX5789

## 2018-02-07 HISTORY — DX: Other intervertebral disc degeneration, thoracolumbar region: M51.35

## 2018-02-07 HISTORY — DX: Cardiac murmur, unspecified: R01.1

## 2018-02-07 HISTORY — DX: Fatty (change of) liver, not elsewhere classified: K76.0

## 2018-02-07 HISTORY — DX: Calculus of kidney: N20.0

## 2018-02-07 HISTORY — PX: HOLMIUM LASER APPLICATION: SHX5852

## 2018-02-07 HISTORY — DX: Personal history of urinary calculi: Z87.442

## 2018-02-07 LAB — POCT I-STAT, CHEM 8
BUN: 29 mg/dL — ABNORMAL HIGH (ref 6–20)
CALCIUM ION: 1.22 mmol/L (ref 1.15–1.40)
CREATININE: 1 mg/dL (ref 0.44–1.00)
Chloride: 105 mmol/L (ref 101–111)
Glucose, Bld: 120 mg/dL — ABNORMAL HIGH (ref 65–99)
HEMATOCRIT: 43 % (ref 36.0–46.0)
HEMOGLOBIN: 14.6 g/dL (ref 12.0–15.0)
POTASSIUM: 4.3 mmol/L (ref 3.5–5.1)
SODIUM: 140 mmol/L (ref 135–145)
TCO2: 27 mmol/L (ref 22–32)

## 2018-02-07 LAB — GLUCOSE, CAPILLARY: GLUCOSE-CAPILLARY: 128 mg/dL — AB (ref 65–99)

## 2018-02-07 SURGERY — CYSTOURETEROSCOPY, WITH RETROGRADE PYELOGRAM AND STENT INSERTION
Anesthesia: General | Site: Ureter | Laterality: Left

## 2018-02-07 MED ORDER — DEXAMETHASONE SODIUM PHOSPHATE 10 MG/ML IJ SOLN
INTRAMUSCULAR | Status: AC
  Filled 2018-02-07: qty 1

## 2018-02-07 MED ORDER — LIDOCAINE 2% (20 MG/ML) 5 ML SYRINGE
INTRAMUSCULAR | Status: AC
  Filled 2018-02-07: qty 5

## 2018-02-07 MED ORDER — SODIUM CHLORIDE 0.9 % IV SOLN
INTRAVENOUS | Status: DC
  Administered 2018-02-07: 07:00:00 via INTRAVENOUS
  Filled 2018-02-07: qty 1000

## 2018-02-07 MED ORDER — SODIUM CHLORIDE 0.9 % IV SOLN
2.0000 g | INTRAVENOUS | Status: AC
  Administered 2018-02-07: 2 g via INTRAVENOUS
  Filled 2018-02-07: qty 20

## 2018-02-07 MED ORDER — MIDAZOLAM HCL 5 MG/5ML IJ SOLN
INTRAMUSCULAR | Status: DC | PRN
Start: 2018-02-07 — End: 2018-02-07
  Administered 2018-02-07: 2 mg via INTRAVENOUS

## 2018-02-07 MED ORDER — OXYCODONE HCL 5 MG PO TABS
5.0000 mg | ORAL_TABLET | Freq: Once | ORAL | Status: AC | PRN
  Administered 2018-02-07: 5 mg via ORAL
  Filled 2018-02-07: qty 1

## 2018-02-07 MED ORDER — SODIUM CHLORIDE 0.9 % IV SOLN
INTRAVENOUS | Status: AC
  Filled 2018-02-07: qty 100

## 2018-02-07 MED ORDER — FENTANYL CITRATE (PF) 100 MCG/2ML IJ SOLN
INTRAMUSCULAR | Status: DC | PRN
  Administered 2018-02-07 (×4): 25 ug via INTRAVENOUS
  Administered 2018-02-07: 50 ug via INTRAVENOUS
  Administered 2018-02-07 (×2): 25 ug via INTRAVENOUS

## 2018-02-07 MED ORDER — ONDANSETRON HCL 4 MG/2ML IJ SOLN
INTRAMUSCULAR | Status: DC | PRN
Start: 2018-02-07 — End: 2018-02-07
  Administered 2018-02-07: 4 mg via INTRAVENOUS

## 2018-02-07 MED ORDER — LIDOCAINE HCL (CARDIAC) 20 MG/ML IV SOLN
INTRAVENOUS | Status: DC | PRN
  Administered 2018-02-07: 100 mg via INTRAVENOUS

## 2018-02-07 MED ORDER — FENTANYL CITRATE (PF) 100 MCG/2ML IJ SOLN
INTRAMUSCULAR | Status: AC
  Filled 2018-02-07: qty 2

## 2018-02-07 MED ORDER — ONDANSETRON HCL 4 MG/2ML IJ SOLN
INTRAMUSCULAR | Status: AC
  Filled 2018-02-07: qty 2

## 2018-02-07 MED ORDER — PROPOFOL 10 MG/ML IV BOLUS
INTRAVENOUS | Status: AC
  Filled 2018-02-07: qty 40

## 2018-02-07 MED ORDER — IOHEXOL 300 MG/ML  SOLN
INTRAMUSCULAR | Status: DC | PRN
  Administered 2018-02-07: 8 mL via URETHRAL

## 2018-02-07 MED ORDER — KETOROLAC TROMETHAMINE 30 MG/ML IJ SOLN
INTRAMUSCULAR | Status: DC | PRN
  Administered 2018-02-07: 30 mg via INTRAVENOUS

## 2018-02-07 MED ORDER — OXYCODONE HCL 5 MG/5ML PO SOLN
5.0000 mg | Freq: Once | ORAL | Status: AC | PRN
  Filled 2018-02-07: qty 5

## 2018-02-07 MED ORDER — PROMETHAZINE HCL 25 MG/ML IJ SOLN
6.2500 mg | INTRAMUSCULAR | Status: DC | PRN
  Filled 2018-02-07: qty 1

## 2018-02-07 MED ORDER — CEFTRIAXONE SODIUM 2 G IJ SOLR
INTRAMUSCULAR | Status: AC
  Filled 2018-02-07: qty 20

## 2018-02-07 MED ORDER — PROPOFOL 10 MG/ML IV BOLUS
INTRAVENOUS | Status: DC | PRN
  Administered 2018-02-07: 50 mg via INTRAVENOUS
  Administered 2018-02-07: 200 mg via INTRAVENOUS

## 2018-02-07 MED ORDER — OXYCODONE HCL 5 MG PO TABS
ORAL_TABLET | ORAL | Status: AC
  Filled 2018-02-07: qty 1

## 2018-02-07 MED ORDER — DEXAMETHASONE SODIUM PHOSPHATE 10 MG/ML IJ SOLN
INTRAMUSCULAR | Status: DC | PRN
  Administered 2018-02-07: 10 mg via INTRAVENOUS

## 2018-02-07 MED ORDER — OXYCODONE-ACETAMINOPHEN 5-325 MG PO TABS: 1.0000 | ORAL_TABLET | ORAL | 0 refills | Status: DC | PRN

## 2018-02-07 MED ORDER — FENTANYL CITRATE (PF) 100 MCG/2ML IJ SOLN
25.0000 ug | INTRAMUSCULAR | Status: DC | PRN
  Administered 2018-02-07: 25 ug via INTRAVENOUS
  Filled 2018-02-07: qty 1

## 2018-02-07 MED ORDER — LACTATED RINGERS IV SOLN
INTRAVENOUS | Status: DC
  Filled 2018-02-07: qty 1000

## 2018-02-07 MED ORDER — MIDAZOLAM HCL 2 MG/2ML IJ SOLN
INTRAMUSCULAR | Status: AC
  Filled 2018-02-07: qty 2

## 2018-02-07 SURGICAL SUPPLY — 30 items
BAG DRAIN URO-CYSTO SKYTR STRL (DRAIN) ×4 IMPLANT
BAG DRN UROCATH (DRAIN) ×2
CATH INTERMIT  6FR 70CM (CATHETERS) ×2 IMPLANT
CLOTH BEACON ORANGE TIMEOUT ST (SAFETY) ×4 IMPLANT
EVACUATOR MICROVAS BLADDER (UROLOGICAL SUPPLIES) IMPLANT
EXTRACTOR STONE 1.7FRX115CM (UROLOGICAL SUPPLIES) ×6 IMPLANT
FIBER LASER FLEXIVA 1000 (UROLOGICAL SUPPLIES) IMPLANT
FIBER LASER FLEXIVA 200 (UROLOGICAL SUPPLIES) IMPLANT
FIBER LASER FLEXIVA 365 (UROLOGICAL SUPPLIES) IMPLANT
FIBER LASER FLEXIVA 550 (UROLOGICAL SUPPLIES) IMPLANT
FIBER LASER TRAC TIP (UROLOGICAL SUPPLIES) ×2 IMPLANT
GLOVE BIO SURGEON STRL SZ8 (GLOVE) ×4 IMPLANT
GOWN STRL REUS W/TWL LRG LVL3 (GOWN DISPOSABLE) ×4 IMPLANT
GOWN STRL REUS W/TWL XL LVL3 (GOWN DISPOSABLE) ×4 IMPLANT
GUIDEWIRE ANG ZIPWIRE 038X150 (WIRE) ×4 IMPLANT
GUIDEWIRE STR DUAL SENSOR (WIRE) ×2 IMPLANT
INFUSOR MANOMETER BAG 3000ML (MISCELLANEOUS) ×4 IMPLANT
IV NS 1000ML (IV SOLUTION) ×4
IV NS 1000ML BAXH (IV SOLUTION) ×2 IMPLANT
IV NS IRRIG 3000ML ARTHROMATIC (IV SOLUTION) ×4 IMPLANT
KIT RM TURNOVER CYSTO AR (KITS) ×4 IMPLANT
MANIFOLD NEPTUNE II (INSTRUMENTS) ×4 IMPLANT
NS IRRIG 500ML POUR BTL (IV SOLUTION) ×4 IMPLANT
PACK CYSTO (CUSTOM PROCEDURE TRAY) ×4 IMPLANT
SHEATH URETERAL 12FRX35CM (MISCELLANEOUS) ×2 IMPLANT
STENT URET 6FRX26 CONTOUR (STENTS) IMPLANT
SYRINGE 10CC LL (SYRINGE) ×4 IMPLANT
TUBE CONNECTING 12'X1/4 (SUCTIONS) ×1
TUBE CONNECTING 12X1/4 (SUCTIONS) ×1 IMPLANT
TUBE FEEDING 8FR 16IN STR KANG (MISCELLANEOUS) IMPLANT

## 2018-02-07 NOTE — Anesthesia Procedure Notes (Signed)
Procedure Name: LMA Insertion Date/Time: 02/07/2018 9:00 AM Performed by: Jessica PriestBeeson, Daylen Lipsky C, CRNA Pre-anesthesia Checklist: Patient identified, Emergency Drugs available, Suction available and Patient being monitored Patient Re-evaluated:Patient Re-evaluated prior to induction Oxygen Delivery Method: Circle system utilized Preoxygenation: Pre-oxygenation with 100% oxygen Induction Type: IV induction Ventilation: Mask ventilation without difficulty LMA: LMA inserted LMA Size: 4.0 Number of attempts: 1 Airway Equipment and Method: Bite block Placement Confirmation: positive ETCO2 and breath sounds checked- equal and bilateral Tube secured with: Tape Dental Injury: Teeth and Oropharynx as per pre-operative assessment

## 2018-02-07 NOTE — Transfer of Care (Signed)
Immediate Anesthesia Transfer of Care Note  Patient: Tara Blevins  Procedure(s) Performed: Procedure(s) (LRB): CYSTOSCOPY WITH RETROGRADE PYELOGRAM, URETEROSCOPY STENT EXCHANGE, STONE BASKETRY (Left) HOLMIUM LASER APPLICATION (Left)  Patient Location: PACU  Anesthesia Type: General  Level of Consciousness: awake, sedated, patient cooperative and responds to stimulation  Airway & Oxygen Therapy: Patient Spontanous Breathing and Patient connected to [place] O2  Post-op Assessment: Report given to PACU RN, Post -op Vital signs reviewed and stable and Patient moving all extremities  Post vital signs: Reviewed and stable  Complications: No apparent anesthesia complications

## 2018-02-07 NOTE — Op Note (Signed)
.  Preoperative diagnosis: Left ureteral stone  Postoperative diagnosis:  Left renal  Procedure: 1 cystoscopy 2. Left retrograde pyelography 3.  Intraoperative fluoroscopy, under one hour, with interpretation 4.  Left ureteroscopic stone manipulation with laser lithotripsy 5.  Left 6 x 26 JJ stent exchange  Attending: Wilkie AyePatrick Lydell Moga  Anesthesia: General  Estimated blood loss: None  Drains: Left 6 x 26 JJ ureteral stent without tether  Specimens: stone for analysis  Antibiotics: rocephin  Findings: left renal pelvis stone. No hydronephrosis. No masses/lesions in the bladder. Ureteral orifices in normal anatomic location.  Indications: Patient is a 52 year old female with a history of left ureteral stone and who underwent left stent placement for persistent pain.  After discussing treatment options, she decided proceed with left ureteroscopic stone manipulation.  Procedure her in detail: The patient was brought to the operating room and a brief timeout was done to ensure correct patient, correct procedure, correct site.  General anesthesia was administered patient was placed in dorsal lithotomy position.  Her genitalia was then prepped and draped in usual sterile fashion.  A rigid 22 French cystoscope was passed in the urethra and the bladder.  Bladder was inspected free masses or lesions.  the ureteral orifices were in the normal orthotopic locations.  Using a grasper the left ureteral stent was brought to the urethral meatus. A zipwire was advanced through the stent and up to the renal pelvis. The stent was then removed. a 6 french ureteral catheter was then instilled into the left ureteral orifice.  a gentle retrograde was obtained and findings noted above.   we then removed the cystoscope and cannulated the left ureteral orifice with a semirigid ureteroscope.  No stone was found in the ureter. Once we reached the UPJ a sensor wire was advanced in to the renal pelvis. We then removed the  ureteroscope and advanced am 12/14 x 35cm access sheath up to the renal pelvis. We then used the flexible ureteroscope to perform nephroscopy. We encountered the stone in the renal pelvis. Using the 200nm laser fiber the stone was fragemnted and the pieces were then removed with a Ngage basket.    once all stone fragments were removed we then removed the access sheath under direct vision and noted no injury to the ureter. We then placed a 6 x 26 double-j ureteral stent over the original zip wire.  We then removed the wire and good coil was noted in the the renal pelvis under fluoroscopy and the bladder under direct vision. the bladder was then drained and this concluded the procedure which was well tolerated by patient.  Complications: None  Condition: Stable, extubated, transferred to PACU  Plan: Patient is to be discharged home as to follow-up in one week for stent removal.

## 2018-02-07 NOTE — Anesthesia Postprocedure Evaluation (Signed)
Anesthesia Post Note  Patient: Tara Blevins  Procedure(s) Performed: CYSTOSCOPY WITH RETROGRADE PYELOGRAM, URETEROSCOPY STENT EXCHANGE, STONE BASKETRY (Left Bladder) HOLMIUM LASER APPLICATION (Left Ureter)     Patient location during evaluation: PACU Anesthesia Type: General Level of consciousness: awake and alert Pain management: pain level controlled Vital Signs Assessment: post-procedure vital signs reviewed and stable Respiratory status: spontaneous breathing, nonlabored ventilation and respiratory function stable Cardiovascular status: blood pressure returned to baseline and stable Postop Assessment: no apparent nausea or vomiting Anesthetic complications: no    Last Vitals:  Vitals:   02/07/18 1028 02/07/18 1030  BP:  131/80  Pulse: 78 80  Resp: 14 12  Temp:    SpO2: 98% 98%    Last Pain:  Vitals:   02/07/18 1028  TempSrc:   PainSc: 5                  Beryle Lathehomas E Jalyah Weinheimer

## 2018-02-07 NOTE — Interval H&P Note (Signed)
History and Physical Interval Note:  02/07/2018 8:48 AM  Tara Blevins  has presented today for surgery, with the diagnosis of LEFT URETERAL CALCULUS  The various methods of treatment have been discussed with the patient and family. After consideration of risks, benefits and other options for treatment, the patient has consented to  Procedure(s): CYSTOSCOPY WITH RETROGRADE PYELOGRAM, URETEROSCOPY AND STENT PLACEMENT (Left) HOLMIUM LASER APPLICATION (Left) as a surgical intervention .  The patient's history has been reviewed, patient examined, no change in status, stable for surgery.  I have reviewed the patient's chart and labs.  Questions were answered to the patient's satisfaction.     Wilkie AyePatrick McKenzie

## 2018-02-07 NOTE — Discharge Instructions (Signed)
NO ADVIL , ALEVE, IBUPROFEN , NAPROSYN UNTIL 3 PM TODAY   Ureteral Stent Implantation, Care After Refer to this sheet in the next few weeks. These instructions provide you with information about caring for yourself after your procedure. Your health care provider may also give you more specific instructions. Your treatment has been planned according to current medical practices, but problems sometimes occur. Call your health care provider if you have any problems or questions after your procedure. What can I expect after the procedure? After the procedure, it is common to have:  Nausea.  Mild pain when you urinate. You may feel this pain in your lower back or lower abdomen. Pain should stop within a few minutes after you urinate. This may last for up to 1 week.  A small amount of blood in your urine for several days.  Follow these instructions at home:  Medicines  Take over-the-counter and prescription medicines only as told by your health care provider.  If you were prescribed an antibiotic medicine, take it as told by your health care provider. Do not stop taking the antibiotic even if you start to feel better.  Do not drive for 24 hours if you received a sedative.  Do not drive or operate heavy machinery while taking prescription pain medicines. Activity  Return to your normal activities as told by your health care provider. Ask your health care provider what activities are safe for you.  Do not lift anything that is heavier than 10 lb (4.5 kg). Follow this limit for 1 week after your procedure, or for as long as told by your health care provider. General instructions  Watch for any blood in your urine. Call your health care provider if the amount of blood in your urine increases.  If you have a catheter: ? Follow instructions from your health care provider about taking care of your catheter and collection bag. ? Do not take baths, swim, or use a hot tub until your health  care provider approves.  Drink enough fluid to keep your urine clear or pale yellow.  Keep all follow-up visits as told by your health care provider. This is important. Contact a health care provider if:  You have pain that gets worse or does not get better with medicine, especially pain when you urinate.  You have difficulty urinating.  You feel nauseous or you vomit repeatedly during a period of more than 2 days after the procedure. Get help right away if:  Your urine is dark red or has blood clots in it.  You are leaking urine (have incontinence).  The end of the stent comes out of your urethra.  You cannot urinate.  You have sudden, sharp, or severe pain in your abdomen or lower back.  You have a fever. This information is not intended to replace advice given to you by your health care provider. Make sure you discuss any questions you have with your health care provider. Document Released: 08/06/2013 Document Revised: 05/11/2016 Document Reviewed: 06/18/2015 Elsevier Interactive Patient Education  2018 [employer]   Post Anesthesia Home Care Instructions  Activity: Get plenty of rest for the remainder of the day. A responsible individual must stay with you for 24 hours following the procedure.  For the next 24 hours, DO NOT: -Drive a car -Advertising copywriterperate machinery -Drink alcoholic beverages -Take any medication unless instructed by your physician -Make any legal decisions or sign important papers.  Meals: Start with liquid foods such as gelatin or soup.  Progress to regular foods as tolerated. Avoid greasy, spicy, heavy foods. If nausea and/or vomiting occur, drink only clear liquids until the nausea and/or vomiting subsides. Call your physician if vomiting continues.  Special Instructions/Symptoms: Your throat may feel dry or sore from the anesthesia or the breathing tube placed in your throat during surgery. If this causes discomfort, gargle with warm salt water. The  discomfort should disappear within 24 hours.  If you had a scopolamine patch placed behind your ear for the management of post- operative nausea and/or vomiting:  1. The medication in the patch is effective for 72 hours, after which it should be removed.  Wrap patch in a tissue and discard in the trash. Wash hands thoroughly with soap and water. 2. You may remove the patch earlier than 72 hours if you experience unpleasant side effects which may include dry mouth, dizziness or visual disturbances. 3. Avoid touching the patch. Wash your hands with soap and water after contact with the patch.

## 2018-02-08 ENCOUNTER — Encounter (HOSPITAL_BASED_OUTPATIENT_CLINIC_OR_DEPARTMENT_OTHER): Payer: Self-pay | Admitting: Urology

## 2019-10-23 ENCOUNTER — Emergency Department (HOSPITAL_COMMUNITY)
Admission: EM | Admit: 2019-10-23 | Discharge: 2019-10-23 | Discharge status: Against Medical Advice | Payer: BLUE CROSS/BLUE SHIELD | Attending: Emergency Medicine | Admitting: Emergency Medicine

## 2019-10-23 ENCOUNTER — Encounter (HOSPITAL_COMMUNITY): Payer: Self-pay | Admitting: Emergency Medicine

## 2019-10-23 ENCOUNTER — Emergency Department (HOSPITAL_COMMUNITY): Payer: BLUE CROSS/BLUE SHIELD

## 2019-10-23 ENCOUNTER — Other Ambulatory Visit: Payer: Self-pay

## 2019-10-23 DIAGNOSIS — Z5320 Procedure and treatment not carried out because of patient's decision for unspecified reasons: Secondary | ICD-10-CM | POA: Diagnosis not present

## 2019-10-23 DIAGNOSIS — N201 Calculus of ureter: Secondary | ICD-10-CM | POA: Diagnosis not present

## 2019-10-23 DIAGNOSIS — Z7982 Long term (current) use of aspirin: Secondary | ICD-10-CM | POA: Diagnosis not present

## 2019-10-23 DIAGNOSIS — N23 Unspecified renal colic: Secondary | ICD-10-CM

## 2019-10-23 DIAGNOSIS — Z7984 Long term (current) use of oral hypoglycemic drugs: Secondary | ICD-10-CM | POA: Diagnosis not present

## 2019-10-23 DIAGNOSIS — R1032 Left lower quadrant pain: Secondary | ICD-10-CM | POA: Diagnosis present

## 2019-10-23 DIAGNOSIS — R112 Nausea with vomiting, unspecified: Secondary | ICD-10-CM | POA: Diagnosis not present

## 2019-10-23 DIAGNOSIS — E119 Type 2 diabetes mellitus without complications: Secondary | ICD-10-CM | POA: Diagnosis not present

## 2019-10-23 DIAGNOSIS — N130 Hydronephrosis with ureteropelvic junction obstruction: Secondary | ICD-10-CM | POA: Insufficient documentation

## 2019-10-23 LAB — I-STAT BETA HCG BLOOD, ED (MC, WL, AP ONLY): I-stat hCG, quantitative: <5 m[IU]/mL (ref <5)

## 2019-10-23 LAB — CBC
HCT: 40.2 % (ref 36.0–46.0)
Hemoglobin: 13.3 g/dL (ref 12.0–15.0)
MCH: 31.1 pg (ref 26.0–34.0)
MCHC: 33.1 g/dL (ref 30.0–36.0)
MCV: 93.9 fL (ref 80.0–100.0)
Platelets: 341 10*3/uL (ref 150–400)
RBC: 4.28 MIL/uL (ref 3.87–5.11)
RDW: 13.3 % (ref 11.5–15.5)
WBC: 14.5 10*3/uL — ABNORMAL HIGH (ref 4.0–10.5)
nRBC: 0 % (ref 0.0–0.2)

## 2019-10-23 LAB — URINALYSIS, ROUTINE W REFLEX MICROSCOPIC
Bilirubin Urine: NEGATIVE
Glucose, UA: NEGATIVE mg/dL
Ketones, ur: NEGATIVE mg/dL
Leukocytes,Ua: NEGATIVE
Nitrite: NEGATIVE
Protein, ur: NEGATIVE mg/dL
Specific Gravity, Urine: 1.017 (ref 1.005–1.030)
pH: 5 (ref 5.0–8.0)

## 2019-10-23 LAB — COMPREHENSIVE METABOLIC PANEL
ALT: 33 U/L (ref 0–44)
AST: 22 U/L (ref 15–41)
Albumin: 3.7 g/dL (ref 3.5–5.0)
Alkaline Phosphatase: 62 U/L (ref 38–126)
Anion gap: 10 (ref 5–15)
BUN: 32 mg/dL — ABNORMAL HIGH (ref 6–20)
CO2: 20 mmol/L — ABNORMAL LOW (ref 22–32)
Calcium: 9.2 mg/dL (ref 8.9–10.3)
Chloride: 106 mmol/L (ref 98–111)
Creatinine, Ser: 1.34 mg/dL — ABNORMAL HIGH (ref 0.44–1.00)
GFR calc Af Amer: 52 mL/min — ABNORMAL LOW (ref >60)
GFR calc non Af Amer: 44 mL/min — ABNORMAL LOW (ref >60)
Glucose, Bld: 174 mg/dL — ABNORMAL HIGH (ref 70–99)
Potassium: 4.4 mmol/L (ref 3.5–5.1)
Sodium: 136 mmol/L (ref 135–145)
Total Bilirubin: 0.5 mg/dL (ref 0.3–1.2)
Total Protein: 7.5 g/dL (ref 6.5–8.1)

## 2019-10-23 LAB — LIPASE, BLOOD: Lipase: 54 U/L — ABNORMAL HIGH (ref 11–51)

## 2019-10-23 MED ORDER — MORPHINE SULFATE (PF) 4 MG/ML IV SOLN
4.0000 mg | Freq: Once | INTRAVENOUS | Status: AC
  Administered 2019-10-23: 4 mg via INTRAVENOUS
  Filled 2019-10-23: qty 1

## 2019-10-23 MED ORDER — SODIUM CHLORIDE 0.9% FLUSH: 3.0000 mL | Freq: Once | INTRAVENOUS | Status: DC

## 2019-10-23 MED ORDER — KETOROLAC TROMETHAMINE 30 MG/ML IJ SOLN
30.0000 mg | Freq: Once | INTRAMUSCULAR | Status: AC
  Administered 2019-10-23: 30 mg via INTRAVENOUS
  Filled 2019-10-23: qty 1

## 2019-10-23 MED ORDER — SODIUM CHLORIDE 0.9 % IV BOLUS
1000.0000 mL | Freq: Once | INTRAVENOUS | Status: AC
  Administered 2019-10-23: 14:00:00 1000 mL via INTRAVENOUS

## 2019-10-23 MED ORDER — DIPHENHYDRAMINE HCL 50 MG/ML IJ SOLN
12.5000 mg | Freq: Once | INTRAMUSCULAR | Status: AC
  Administered 2019-10-23: 12.5 mg via INTRAVENOUS
  Filled 2019-10-23: qty 1

## 2019-10-23 MED ORDER — HYDROMORPHONE HCL 1 MG/ML IJ SOLN: 1.0000 mg | Freq: Once | INTRAMUSCULAR | Status: DC

## 2019-10-23 MED ORDER — ONDANSETRON HCL 4 MG/2ML IJ SOLN
4.0000 mg | Freq: Once | INTRAMUSCULAR | Status: AC
  Administered 2019-10-23: 4 mg via INTRAVENOUS
  Filled 2019-10-23: qty 2

## 2019-10-23 NOTE — ED Triage Notes (Signed)
Pt complains of left lower abd pain that started today suddenly while she was at work. Pt is nauseated and started vomiting. Pt has hx of multiple kidney stones. No flank pain today

## 2019-10-23 NOTE — ED Provider Notes (Signed)
[facility] EMERGENCY DEPARTMENT Provider Note   CSN: [id number] Arrival date & time: 10/23/19  1310     History   Chief Complaint Chief Complaint  Patient presents with  . Abdominal Pain    HPI Tara Blevins is a 53 y.o. female.  She is presenting with acute onset of left lower quadrant abdominal pain that started at work here today at around 1030 or 11 this morning.  She tried to eat lunch but became nauseous and vomited up.  She has a history of kidney stones she says she has had similar symptoms as this with kidney stones.  She has not noticed any hematuria or flank pain.  No fevers or chills.  No cough or shortness of breath.  Has tried nothing for it.  Rates the pain is severe aching and does not get worse with palpation or movement.     The history is provided by the patient.  Abdominal Pain Pain location:  LLQ Pain quality: aching   Pain radiates to:  Does not radiate Pain severity:  Severe Onset quality:  Sudden Duration:  3 hours Timing:  Constant Progression:  Unchanged Chronicity:  Recurrent Context: not recent travel and not trauma   Relieved by:  None tried Worsened by:  Nothing Ineffective treatments:  None tried Associated symptoms: nausea and vomiting   Associated symptoms: no chest pain, no constipation, no cough, no diarrhea, no dysuria, no fever, no hematemesis, no hematochezia, no hematuria, no shortness of breath and no sore throat     Past Medical History:  Diagnosis Date  . Back pain   . Bilateral nephrolithiasis 09/21/2017   noted on spine xray  . DDD (degenerative disc disease), thoracolumbar 09/21/2017   T12-L1, L2-L3, noted on Spine Xray , shoulders  . Diabetes mellitus without complication (HCC)   . Fatty liver   . Heart murmur   . High cholesterol   . History of kidney stones   . Hypertension   . Kidney stone   . kidney stones     There are no active problems to display for this patient.   Past Surgical  History:  Procedure Laterality Date  . ABDOMINAL HYSTERECTOMY    . ADENOIDECTOMY    . BACK SURGERY    . COLONOSCOPY    . CYSTOSCOPY WITH RETROGRADE PYELOGRAM, URETEROSCOPY AND STENT PLACEMENT Left 02/07/2018   Procedure: CYSTOSCOPY WITH RETROGRADE PYELOGRAM, URETEROSCOPY STENT EXCHANGE, STONE BASKETRY;  Surgeon: Malen Gauze, MD;  Location: [facility];  Service: Urology;  Laterality: Left;  . CYSTOSCOPY/RETROGRADE/URETEROSCOPY Left 01/24/2018   Procedure: CYSTOSCOPY/LEFT RETROGRADE/LEFT URETEROSCOPY AND LEFT STENT PLACEMENT;  Surgeon: Malen Gauze, MD;  Location: [facility];  Service: Urology;  Laterality: Left;  . HOLMIUM LASER APPLICATION Left 02/07/2018   Procedure: HOLMIUM LASER APPLICATION;  Surgeon: Malen Gauze, MD;  Location: [facility];  Service: Urology;  Laterality: Left;  . LUMBAR LAMINECTOMY  07/2017  . OOPHORECTOMY    . TONSILLECTOMY       OB History   No obstetric history on file.      Home Medications    Prior to Admission medications   Medication Sig Start Date End Date Taking? Authorizing Provider  aspirin EC 81 MG tablet Take 81 mg by mouth daily.    [provider]  cephALEXin (KEFLEX) 500 MG capsule Take 1 capsule (500 mg total) by mouth 3 (three) times daily. 01/21/18   Azalia Bilis, MD  HYDROcodone-acetaminophen (NORCO/VICODIN)  5-325 MG tablet Take 1 tablet by mouth every 4 (four) hours as needed for moderate pain. 01/21/18   Azalia Bilis, MD  lisinopril (PRINIVIL,ZESTRIL) 2.5 MG tablet Take 2.5 mg by mouth daily.    [provider]  metFORMIN (GLUCOPHAGE) 500 MG tablet Take by mouth daily with breakfast.    [provider]  ondansetron (ZOFRAN ODT) 8 MG disintegrating tablet Take 1 tablet (8 mg total) by mouth every 8 (eight) hours as needed for nausea or vomiting. 01/21/18   Azalia Bilis, MD  oxyCODONE-acetaminophen (PERCOCET) 5-325 MG tablet Take 1 tablet by mouth every  4 (four) hours as needed for severe pain. 02/07/18   McKenzie, Mardene Celeste, MD  tamsulosin (FLOMAX) 0.4 MG CAPS capsule Take 1 capsule (0.4 mg total) by mouth daily. Patient taking differently: Take 0.4 mg by mouth daily after breakfast.  01/24/18   Long, Arlyss Repress, MD  traMADol (ULTRAM) 50 MG tablet Take 1 tablet (50 mg total) by mouth every 6 (six) hours as needed. 01/24/18   Long, Arlyss Repress, MD  UNKNOWN TO PATIENT For Cholesterol    [provider]  Vitamin D, Ergocalciferol, (DRISDOL) 50000 units CAPS capsule Take 50,000 Units by mouth every 7 (seven) days.    [provider]    Family History Family History  Problem Relation Age of Onset  . Heart disease Father     Social History Social History   Tobacco Use  . Smoking status: Never Smoker  . Smokeless tobacco: Never Used  Substance Use Topics  . Alcohol use: No  . Drug use: No     Allergies   Tomato   Review of Systems Review of Systems  Constitutional: Negative for fever.  HENT: Negative for sore throat.   Eyes: Negative for visual disturbance.  Respiratory: Negative for cough and shortness of breath.   Cardiovascular: Negative for chest pain.  Gastrointestinal: Positive for abdominal pain, nausea and vomiting. Negative for constipation, diarrhea, hematemesis and hematochezia.  Genitourinary: Negative for dysuria and hematuria.  Musculoskeletal: Negative for back pain.  Skin: Negative for rash.  Neurological: Negative for headaches.     Physical Exam Updated Vital Signs BP (!) 174/100 (BP Location: Right Arm)   Pulse 82   Temp 97.7 F (36.5 C) (Oral)   Resp 20   SpO2 98%   Physical Exam Vitals signs and nursing note reviewed.  Constitutional:      General: She is not in acute distress.    Appearance: She is well-developed.  HENT:     Head: Normocephalic and atraumatic.  Eyes:     Conjunctiva/sclera: Conjunctivae normal.  Neck:     Musculoskeletal: Neck supple.  Cardiovascular:     Rate  and Rhythm: Normal rate and regular rhythm.     Heart sounds: No murmur.  Pulmonary:     Effort: Pulmonary effort is normal. No respiratory distress.     Breath sounds: Normal breath sounds.  Abdominal:     Palpations: Abdomen is soft.     Tenderness: There is no abdominal tenderness. There is no guarding or rebound.  Musculoskeletal: Normal range of motion.        General: No deformity or signs of injury.  Skin:    General: Skin is warm and dry.     Capillary Refill: Capillary refill takes less than 2 seconds.  Neurological:     General: No focal deficit present.     Mental Status: She is alert and oriented to person, place, and time.  Sensory: No sensory deficit.     Motor: No weakness.      ED Treatments / Results  Labs (all labs ordered are listed, but only abnormal results are displayed) Labs Reviewed  LIPASE, BLOOD - Abnormal; Notable for the following components:      Result Value   Lipase 54 (*)    All other components within normal limits  COMPREHENSIVE METABOLIC PANEL - Abnormal; Notable for the following components:   CO2 20 (*)    Glucose, Bld 174 (*)    BUN 32 (*)    Creatinine, Ser 1.34 (*)    GFR calc non Af Amer 44 (*)    GFR calc Af Amer 52 (*)    All other components within normal limits  CBC - Abnormal; Notable for the following components:   WBC 14.5 (*)    All other components within normal limits  URINALYSIS, ROUTINE W REFLEX MICROSCOPIC - Abnormal; Notable for the following components:   Color, Urine STRAW (*)    Hgb urine dipstick MODERATE (*)    Bacteria, UA RARE (*)    All other components within normal limits  I-STAT BETA HCG BLOOD, ED ([facility], [facility], [facility] ONLY)    EKG None  Radiology No results found.  Procedures Procedures (including critical care time)  Medications Ordered in ED Medications  sodium chloride flush (NS) 0.9 % injection 3 mL (has no administration in time range)  morphine 4 MG/ML injection 4 mg (has no administration  in time range)  ondansetron (ZOFRAN) injection 4 mg (has no administration in time range)  sodium chloride 0.9 % bolus 1,000 mL (has no administration in time range)  ketorolac (TORADOL) 30 MG/ML injection 30 mg (has no administration in time range)  diphenhydrAMINE (BENADRYL) injection 12.5 mg (has no administration in time range)     Initial Impression / Assessment and Plan / ED Course  I have reviewed the triage vital signs and the nursing notes.  Pertinent labs & imaging results that were available during my care of the patient were reviewed by me and considered in my medical decision making (see chart for details).  Clinical Course as of Oct 22 1857  Thu Oct 23, 2019  [id number] 53 year old female with acute onset of left lower quadrant abdominal pain.  Differential includes kidney stones, pyelonephritis, diverticulitis, intestinal perforation.  Patient states she received IV medication when she was at [facility] for kidney stones that caused her to break out in hives.  I reviewed her last visit here and I do not see any comment upon any kind of allergic reaction.  Her allergy history here is only of tomatoes.  We will give her Benadryl along with Toradol morphine Zofran.   [MB]  1445 Patient's lab significant for an elevated white blood count of 14.5 and an elevated creatinine of 1.34 from her baseline of 1.0.  Glucose elevated at 174.  Lipase mildly elevated at 54.  Pregnancy test negative.   [MB]  1455 Reevaluated patient.  She says the medication did help but the pain is starting to come back.  She did not experience any type of allergic reaction with it so we will repeat a dose of morphine.   [MB]    Clinical Course User Index [MB] Terrilee FilesButler, Amaka Gluth C, MD     Patient signed out to Dr Denton LankSteinl with plan for followup on CT and UA. Likely discharge if symptoms controlled.     Final Clinical Impressions(s) / ED Diagnoses  Final diagnoses:  Ureterolithiasis    ED Discharge  Orders    None       Hayden Rasmussen, MD 10/23/19 1859

## 2019-10-23 NOTE — ED Provider Notes (Addendum)
Signed out by Dr Melina Copa to d/c to home when CT resulted. Stats pain is improving w meds.   Check for ct - still pending.   After getting out of trauma code x 2, and a post-cpr - went to recheck on ct....ct now back - CT c/w left UPJ stone.   Went to recheck pt and discuss results - patient upset, states pain has returned, tired of waiting - I apologized about delay, and offered dilaudid iv, ordered dose.   Went to recheck pt to check response to dilaudid - pt had left ED AMA prior to completion treatment, without notifying myself/staff - it appears pt left prior to the additional pain med being given. Charge rn indicates iv was discontinued prior to pt leaving.       Lajean Saver, MD 10/23/19 [phone]

## 2020-01-26 IMAGING — CT CT ABD-PELV W/ CM
2 of 5 series · 16 of 46 positions shown, 18 images · IV contrast (APPLIED)
Comparison: 05/07/2017 CT abdomen and pelvis.

CLINICAL DATA: 51 y/o F; hematuria and left-sided abdominal pains
for 5 days.

EXAM:
CT ABDOMEN AND PELVIS WITH CONTRAST
TECHNIQUE: Multidetector CT imaging of the abdomen and pelvis was performed
using the standard protocol following bolus administration of
intravenous contrast.
CONTRAST:  100mL [id number] IOPAMIDOL ([id number]) INJECTION 61%

[Series 2: axial st · axial · 0.85mm/px · z∈[-422,-36]mm · 13 of 87 slices shown, 15 images]
[im 5/87  soft-tissue]
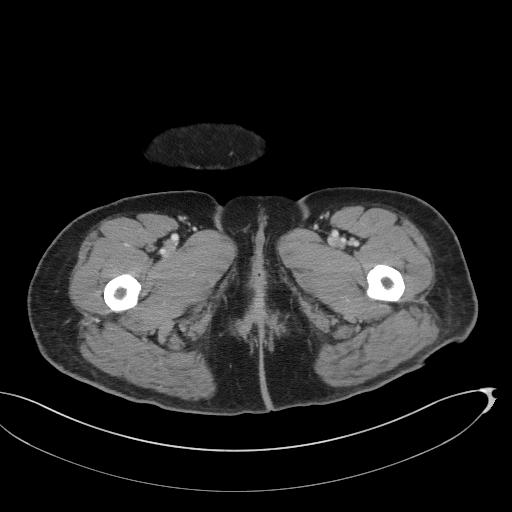
[im 5/87  bone]
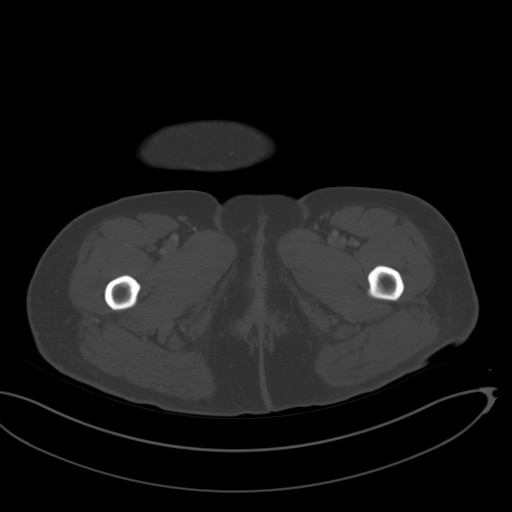
[im 14/87  soft-tissue]
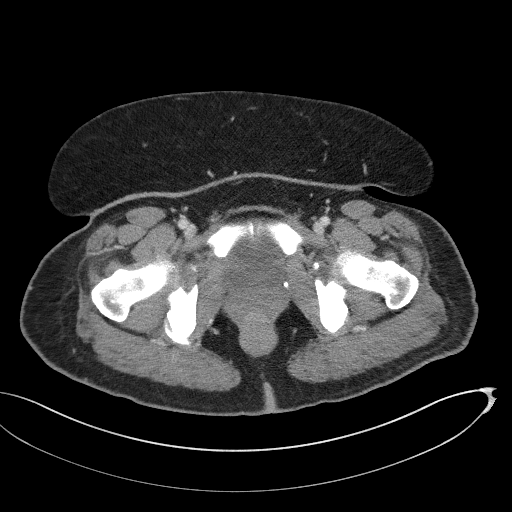
[im 19/87  soft-tissue]
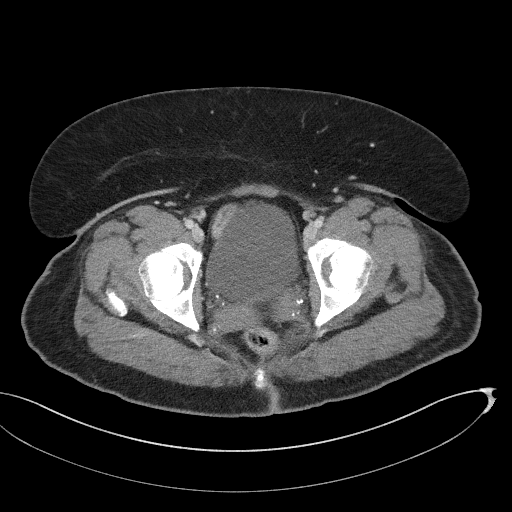
[im 23/87  soft-tissue]
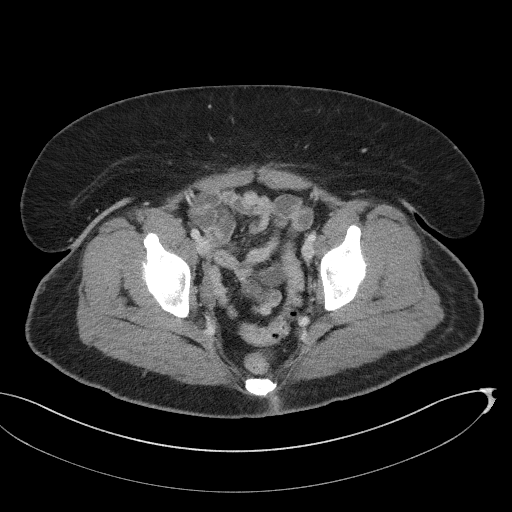
[im 32/87  soft-tissue]
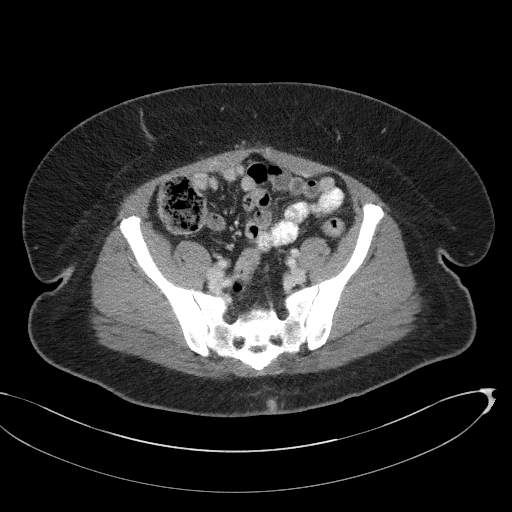
[im 37/87  soft-tissue]
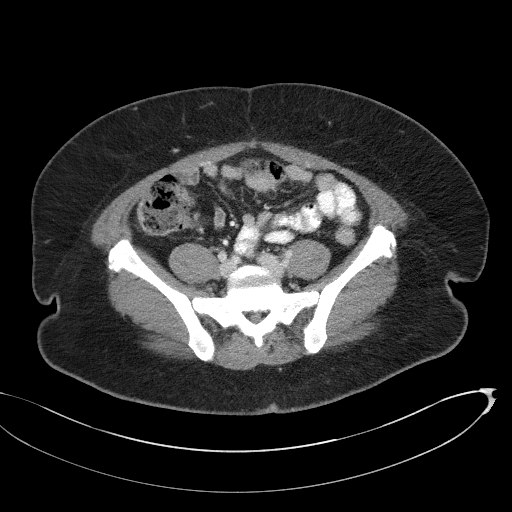
[im 46/87  soft-tissue]
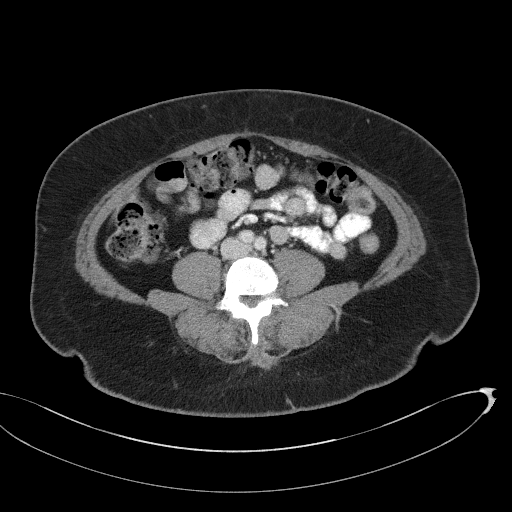
[im 50/87  soft-tissue]
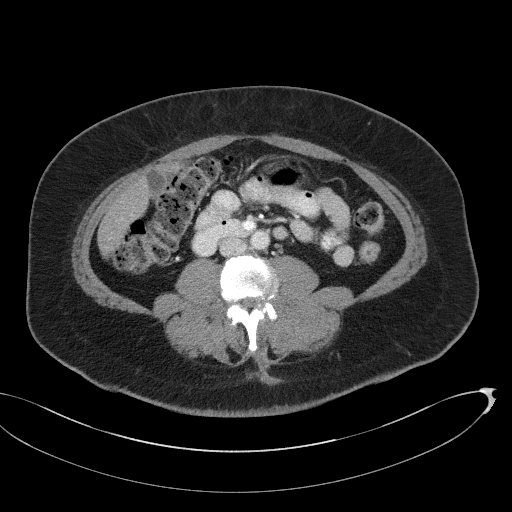
[im 55/87  soft-tissue]
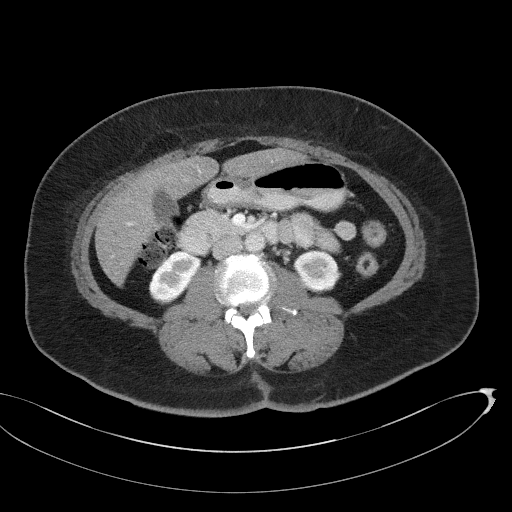
[im 55/87  bone]
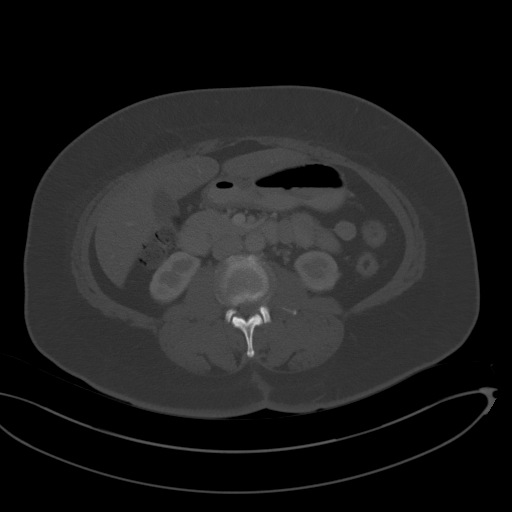
[im 64/87  soft-tissue]
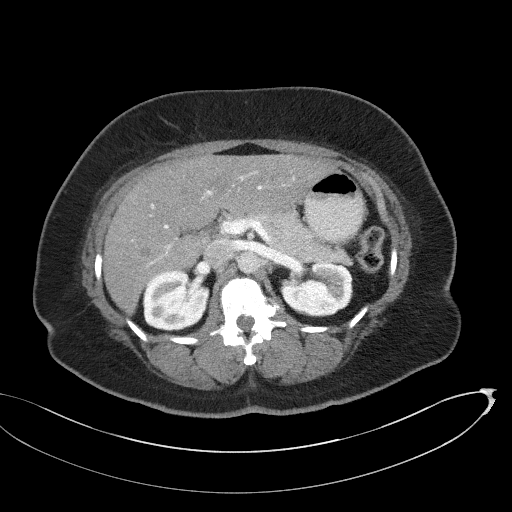
[im 68/87  soft-tissue]
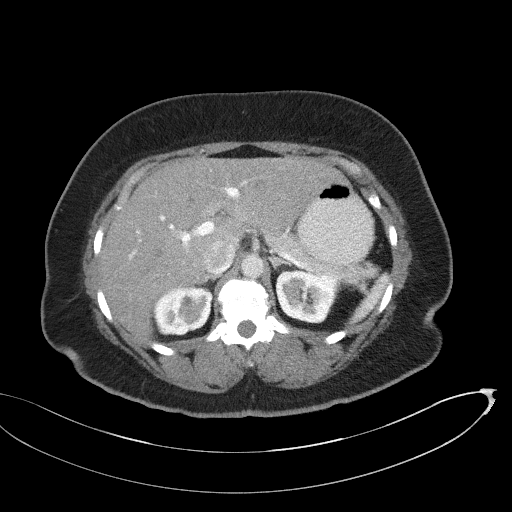
[im 73/87  soft-tissue]
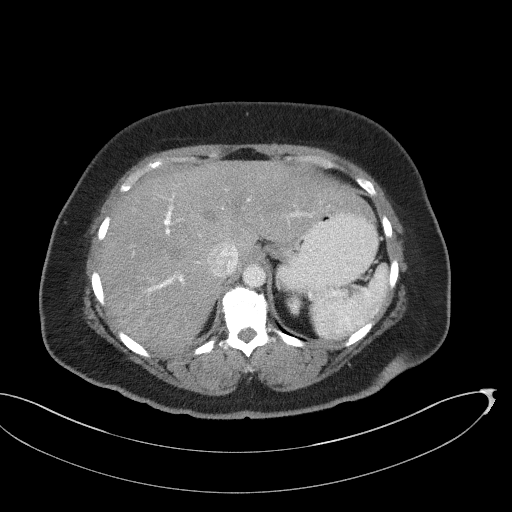
[im 82/87  soft-tissue]
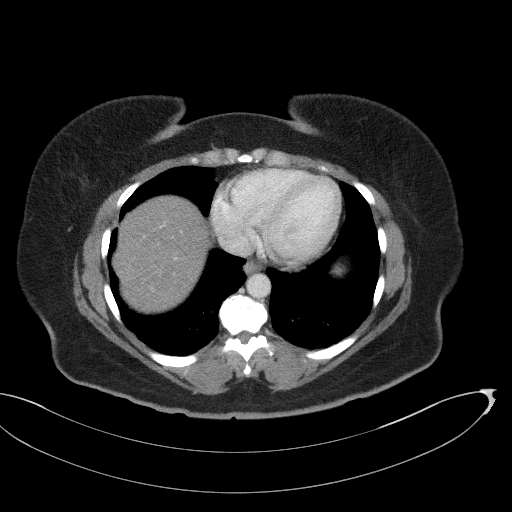

[Series 5: coronal st · coronal · 0.72mm/px · 3 of 95 slices shown]
[im 32/95  soft-tissue]
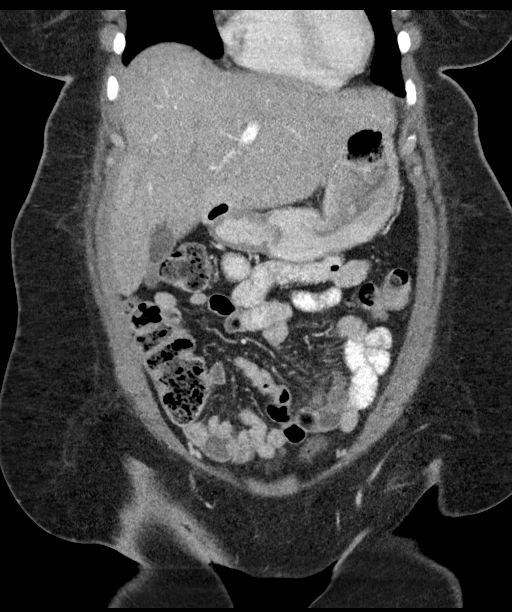
[im 42/95  soft-tissue]
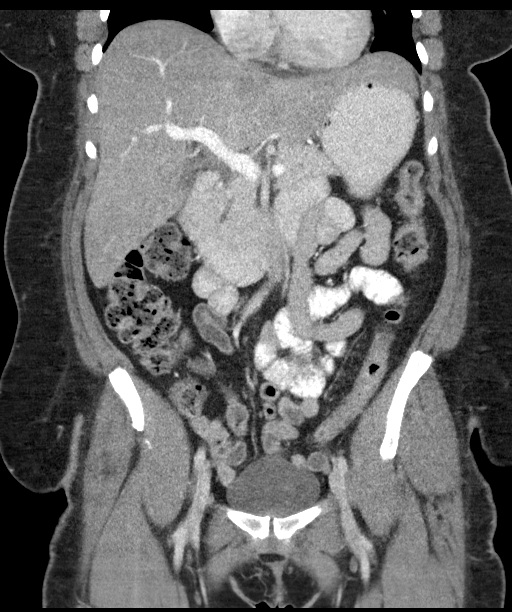
[im 53/95  soft-tissue]
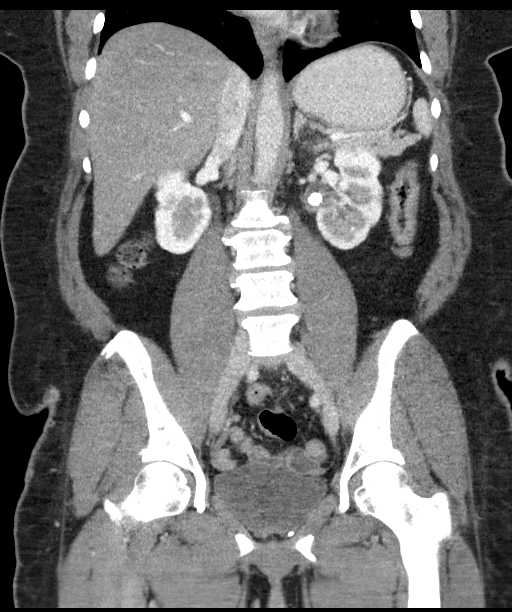

[16 of 46 positions shown; findings below may reference images not displayed]

FINDINGS: Lower chest: No acute abnormality.

Hepatobiliary: No focal liver abnormality is seen. No gallstones,
gallbladder wall thickening, or biliary dilatation.

Pancreas: Unremarkable. No pancreatic ductal dilatation or
surrounding inflammatory changes.

Spleen: Normal in size without focal abnormality.

Adrenals/Urinary Tract: Normal adrenal glands. Subcentimeter renal
cysts bilaterally. 3 mm nonobstructing stones in the upper and lower
poles of right kidney. 9 mm stone within the left kidney pelvis
having migrated from the lower pole on the prior study. No
hydronephrosis or pelvicaliectasis. Urothelial enhancement of the
left renal pelvis and proximal ureter. Normal bladder.

Stomach/Bowel: Stomach is within normal limits. Appendix appears
normal. No evidence of bowel wall thickening, distention, or
inflammatory changes.

Vascular/Lymphatic: No significant vascular findings are present. No
enlarged abdominal or pelvic lymph nodes.

Reproductive: Status post hysterectomy. No adnexal masses.

Other: No abdominal wall hernia or abnormality. No abdominopelvic
ascites.

Musculoskeletal: Moderate multilevel spondylosis of the lumbar spine
with disc bulges, loss of disc space height, and facet hypertrophy.
Left L3-L4 hemilaminectomy changes.
IMPRESSION: 1. 9 mm stone within left renal pelvis without obstruction.
Urothelial enhancement of left renal pelvis and proximal ureter,
likely associated infectious or inflammatory pyelitis.
2. Right kidney nonobstructing kidney stones.

By: Sangkwon Jolicoeur M.D.

## 2020-10-28 ENCOUNTER — Other Ambulatory Visit: Payer: Self-pay | Admitting: Neurosurgery

## 2020-11-02 ENCOUNTER — Other Ambulatory Visit: Payer: Self-pay

## 2020-11-02 ENCOUNTER — Encounter
Admission: RE | Admit: 2020-11-02 | Discharge: 2020-11-02 | Disposition: A | Discharge status: Home / Self Care | Payer: BLUE CROSS/BLUE SHIELD | Source: Ambulatory Visit | Attending: Neurosurgery | Admitting: Neurosurgery

## 2020-11-02 DIAGNOSIS — Z01818 Encounter for other preprocedural examination: Secondary | ICD-10-CM | POA: Diagnosis not present

## 2020-11-02 DIAGNOSIS — E119 Type 2 diabetes mellitus without complications: Secondary | ICD-10-CM | POA: Insufficient documentation

## 2020-11-02 DIAGNOSIS — I10 Essential (primary) hypertension: Secondary | ICD-10-CM | POA: Insufficient documentation

## 2020-11-02 LAB — URINALYSIS, ROUTINE W REFLEX MICROSCOPIC
Bilirubin Urine: NEGATIVE
Glucose, UA: NEGATIVE mg/dL
Hgb urine dipstick: NEGATIVE
Ketones, ur: NEGATIVE mg/dL
Leukocytes,Ua: NEGATIVE
Nitrite: NEGATIVE
Protein, ur: NEGATIVE mg/dL
Specific Gravity, Urine: 1.013 (ref 1.005–1.030)
pH: 5 (ref 5.0–8.0)

## 2020-11-02 LAB — CBC
HCT: 40.8 % (ref 36.0–46.0)
Hemoglobin: 13.3 g/dL (ref 12.0–15.0)
MCH: 31 pg (ref 26.0–34.0)
MCHC: 32.6 g/dL (ref 30.0–36.0)
MCV: 95.1 fL (ref 80.0–100.0)
Platelets: 295 10*3/uL (ref 150–400)
RBC: 4.29 MIL/uL (ref 3.87–5.11)
RDW: 14 % (ref 11.5–15.5)
WBC: 7.2 10*3/uL (ref 4.0–10.5)
nRBC: 0 % (ref 0.0–0.2)

## 2020-11-02 LAB — TYPE AND SCREEN
ABO/RH(D): B POS
Antibody Screen: NEGATIVE

## 2020-11-02 LAB — BASIC METABOLIC PANEL
Anion gap: 9 (ref 5–15)
BUN: 20 mg/dL (ref 6–20)
CO2: 28 mmol/L (ref 22–32)
Calcium: 9.3 mg/dL (ref 8.9–10.3)
Chloride: 103 mmol/L (ref 98–111)
Creatinine, Ser: 1.04 mg/dL — ABNORMAL HIGH (ref 0.44–1.00)
GFR, Estimated: >60 mL/min (ref >60)
Glucose, Bld: 129 mg/dL — ABNORMAL HIGH (ref 70–99)
Potassium: 4 mmol/L (ref 3.5–5.1)
Sodium: 140 mmol/L (ref 135–145)

## 2020-11-02 LAB — APTT: aPTT: 26 seconds (ref 24–36)

## 2020-11-02 LAB — PROTIME-INR
INR: 1.1 (ref 0.8–1.2)
Prothrombin Time: 13.3 seconds (ref 11.4–15.2)

## 2020-11-02 LAB — SURGICAL PCR SCREEN
MRSA, PCR: NEGATIVE
Staphylococcus aureus: NEGATIVE

## 2020-11-02 NOTE — Pre-Procedure Instructions (Signed)
FYI - Pt claustrophobic. Decreased ROM right shoulder (hx shoulder arthroscopy).

## 2020-11-02 NOTE — Patient Instructions (Addendum)
Your procedure is scheduled on: 11/08/20 and 11/15/20 Report to DAY SURGERY DEPARTMENT LOCATED ON 2ND FLOOR MEDICAL MALL ENTRANCE. To find out your arrival time please call [phone] between 1PM - 3PM on 11/05/20 and 11/12/20.  Remember: Instructions that are not followed completely may result in serious medical risk, up to and including death, or upon the discretion of your surgeon and anesthesiologist your surgery may need to be rescheduled.     _X__ 1. Do not eat food after midnight the night before your procedure.                 No gum chewing or hard candies. You may drink clear liquids up to 2 hours                 before you are scheduled to arrive for your surgery- DO not drink clear                 liquids within 2 hours of the start of your surgery.                 Clear Liquids include:  water, apple juice without pulp, clear carbohydrate                 drink such as Clearfast or Gatorade, Black Coffee or Tea (Do not add                 anything to coffee or tea). Diabetics water only  __X__2.  On the morning of surgery brush your teeth with toothpaste and water, you                 may rinse your mouth with mouthwash if you wish.  Do not swallow any              toothpaste of mouthwash.     _X__ 3.  No Alcohol for 24 hours before or after surgery.   _X__ 4.  Do Not Smoke or use e-cigarettes For 24 Hours Prior to Your Surgery.                 Do not use any chewable tobacco products for at least 6 hours prior to                 surgery.  ____  5.  Bring all medications with you on the day of surgery if instructed.   __X__  6.  Notify your doctor if there is any change in your medical condition      (cold, fever, infections).     Do not wear jewelry, make-up, hairpins, clips or nail polish. Do not wear lotions, powders, or perfumes.  Do not shave 48 hours prior to surgery. Men may shave face and neck. Do not bring valuables to the hospital.    [facility] is not  responsible for any belongings or valuables.  Contacts, dentures/partials or body piercings may not be worn into surgery. Bring a case for your contacts, glasses or hearing aids, a denture cup will be supplied. Leave your suitcase in the car. After surgery it may be brought to your room. For patients admitted to the hospital, discharge time is determined by your treatment team.   Patients discharged the day of surgery will not be allowed to drive home.   Please read over the following fact sheets that you were given:   MRSA Information  __X__ Take these medicines the morning of surgery with A SIP  OF WATER:    1. pravastatin (PRAVACHOL) 20 MG tablet  2.   3.   4.  5.  6.  ____ Fleet Enema (as directed)   __X__ Use CHG Soap/SAGE wipes as directed  ____ Use inhalers on the day of surgery  __X__ Stop metformin/Janumet/Farxiga 2 days prior to surgery    ____ Take 1/2 of usual insulin dose the night before surgery. No insulin the morning          of surgery.   ____ Stop Blood Thinners Coumadin/Plavix/Xarelto/Pleta/Pradaxa/Eliquis/Effient/Aspirin  on   Or contact your Surgeon, Cardiologist or Medical Doctor regarding  ability to stop your blood thinners  __X__ Stop Anti-inflammatories 7 days before surgery such as Advil, Ibuprofen, Motrin,  BC or Goodies Powder, Naprosyn, Naproxen, Aleve, Aspirin   HOLD ASPIRIN UNTIL AFTER 11/15/20 PROCEDURE  __X__ Stop all herbal supplements, fish oil or vitamin E until after surgery.    ____ Bring C-Pap to the hospital.

## 2020-11-05 ENCOUNTER — Other Ambulatory Visit
Admission: RE | Admit: 2020-11-05 | Discharge: 2020-11-05 | Disposition: A | Discharge status: Home / Self Care | Payer: BLUE CROSS/BLUE SHIELD | Source: Ambulatory Visit | Attending: Neurosurgery | Admitting: Neurosurgery

## 2020-11-05 ENCOUNTER — Other Ambulatory Visit: Payer: Self-pay

## 2020-11-05 DIAGNOSIS — Z01812 Encounter for preprocedural laboratory examination: Secondary | ICD-10-CM | POA: Diagnosis not present

## 2020-11-05 DIAGNOSIS — Z20822 Contact with and (suspected) exposure to covid-19: Secondary | ICD-10-CM | POA: Diagnosis not present

## 2020-11-06 LAB — SARS CORONAVIRUS 2 (TAT 6-24 HRS): SARS Coronavirus 2: NEGATIVE

## 2020-11-07 MED ORDER — CHLORHEXIDINE GLUCONATE 0.12 % MT SOLN: 15.0000 mL | Freq: Once | OROMUCOSAL | Status: AC

## 2020-11-07 MED ORDER — SODIUM CHLORIDE 0.9 % IV SOLN: INTRAVENOUS | Status: DC

## 2020-11-07 MED ORDER — FAMOTIDINE 20 MG PO TABS: 20.0000 mg | ORAL_TABLET | Freq: Once | ORAL | Status: AC

## 2020-11-07 MED ORDER — CEFAZOLIN SODIUM-DEXTROSE 2-4 GM/100ML-% IV SOLN
2.0000 g | INTRAVENOUS | Status: AC
  Administered 2020-11-08: 2 g via INTRAVENOUS

## 2020-11-07 MED ORDER — ORAL CARE MOUTH RINSE: 15.0000 mL | Freq: Once | OROMUCOSAL | Status: AC

## 2020-11-07 NOTE — Progress Notes (Signed)
Pharmacy Antibiotic Note  JALESIA LOUDENSLAGER is a 54 y.o. female admitted on (Not on file) with surgical prophylaxis.  Pharmacy has been consulted for cefazolin dosing. TBW = 81.2 kg   Plan: Cefazolin 2 gm IV X 1 60 min  Pre-Op     No data recorded.  Recent Labs  Lab 11/02/20 1005  WBC 7.2  CREATININE 1.04*    Estimated Creatinine Clearance: 57 mL/min (A) (by C-G formula based on SCr of 1.04 mg/dL (H)).    Allergies  Allergen Reactions  . Tomato Hives  . Bupropion Anxiety    Antimicrobials this admission:   >>    >>   Dose adjustments this admission:   Microbiology results:  BCx:   UCx:    Sputum:    MRSA PCR:   Thank you for allowing pharmacy to be a part of this patient's care.  Edison Nicholson D 11/07/2020 11:18 PM

## 2020-11-08 ENCOUNTER — Ambulatory Visit: Payer: BLUE CROSS/BLUE SHIELD

## 2020-11-08 ENCOUNTER — Ambulatory Visit: Payer: BLUE CROSS/BLUE SHIELD | Admitting: Certified Registered"

## 2020-11-08 ENCOUNTER — Other Ambulatory Visit: Payer: Self-pay

## 2020-11-08 ENCOUNTER — Encounter
Admission: RE | Disposition: A | Discharge status: Home / Self Care | Payer: Self-pay | Source: Home / Self Care | Attending: Neurosurgery

## 2020-11-08 ENCOUNTER — Encounter: Payer: Self-pay | Admitting: Neurosurgery

## 2020-11-08 ENCOUNTER — Observation Stay
Admission: RE | Admit: 2020-11-08 | Discharge: 2020-11-09 | Disposition: A | Discharge status: Home / Self Care | Payer: BLUE CROSS/BLUE SHIELD | Attending: Neurosurgery | Admitting: Neurosurgery

## 2020-11-08 DIAGNOSIS — Z20822 Contact with and (suspected) exposure to covid-19: Secondary | ICD-10-CM | POA: Diagnosis not present

## 2020-11-08 DIAGNOSIS — E119 Type 2 diabetes mellitus without complications: Secondary | ICD-10-CM | POA: Diagnosis not present

## 2020-11-08 DIAGNOSIS — Z7984 Long term (current) use of oral hypoglycemic drugs: Secondary | ICD-10-CM | POA: Insufficient documentation

## 2020-11-08 DIAGNOSIS — Z79899 Other long term (current) drug therapy: Secondary | ICD-10-CM | POA: Diagnosis not present

## 2020-11-08 DIAGNOSIS — G8929 Other chronic pain: Secondary | ICD-10-CM | POA: Diagnosis present

## 2020-11-08 DIAGNOSIS — I10 Essential (primary) hypertension: Secondary | ICD-10-CM | POA: Insufficient documentation

## 2020-11-08 DIAGNOSIS — G894 Chronic pain syndrome: Principal | ICD-10-CM | POA: Insufficient documentation

## 2020-11-08 DIAGNOSIS — Z9689 Presence of other specified functional implants: Secondary | ICD-10-CM

## 2020-11-08 DIAGNOSIS — M549 Dorsalgia, unspecified: Secondary | ICD-10-CM | POA: Diagnosis present

## 2020-11-08 HISTORY — PX: THORACIC LAMINECTOMY FOR SPINAL CORD STIMULATOR: SHX6887

## 2020-11-08 LAB — ABO/RH: ABO/RH(D): B POS

## 2020-11-08 LAB — GLUCOSE, CAPILLARY
Glucose-Capillary: 156 mg/dL — ABNORMAL HIGH (ref 70–99)
Glucose-Capillary: 142 mg/dL — ABNORMAL HIGH (ref 70–99)
Glucose-Capillary: 271 mg/dL — ABNORMAL HIGH (ref 70–99)
Glucose-Capillary: 270 mg/dL — ABNORMAL HIGH (ref 70–99)

## 2020-11-08 SURGERY — THORACIC LAMINECTOMY FOR SPINAL CORD STIMULATOR
Anesthesia: General

## 2020-11-08 MED ORDER — METHOCARBAMOL 500 MG PO TABS: 750.0000 mg | ORAL_TABLET | Freq: Four times a day (QID) | ORAL | Status: DC

## 2020-11-08 MED ORDER — ACETAMINOPHEN 10 MG/ML IV SOLN
INTRAVENOUS | Status: AC
  Filled 2020-11-08: qty 100

## 2020-11-08 MED ORDER — ROCURONIUM BROMIDE 10 MG/ML (PF) SYRINGE
PREFILLED_SYRINGE | INTRAVENOUS | Status: AC
  Filled 2020-11-08: qty 10

## 2020-11-08 MED ORDER — SENNA 8.6 MG PO TABS
2.0000 | ORAL_TABLET | Freq: Two times a day (BID) | ORAL | Status: DC
  Administered 2020-11-08 – 2020-11-09 (×2): 17.2 mg via ORAL
  Filled 2020-11-08 (×3): qty 2

## 2020-11-08 MED ORDER — FENTANYL CITRATE (PF) 100 MCG/2ML IJ SOLN
INTRAMUSCULAR | Status: AC
  Administered 2020-11-08: 25 ug via INTRAVENOUS
  Filled 2020-11-08: qty 2

## 2020-11-08 MED ORDER — DEXMEDETOMIDINE (PRECEDEX) IN NS 20 MCG/5ML (4 MCG/ML) IV SYRINGE
PREFILLED_SYRINGE | INTRAVENOUS | Status: DC | PRN
  Administered 2020-11-08: 8 ug via INTRAVENOUS

## 2020-11-08 MED ORDER — DEXAMETHASONE SODIUM PHOSPHATE 10 MG/ML IJ SOLN
INTRAMUSCULAR | Status: AC
  Filled 2020-11-08: qty 2

## 2020-11-08 MED ORDER — PROPOFOL 500 MG/50ML IV EMUL
INTRAVENOUS | Status: AC
  Filled 2020-11-08: qty 150

## 2020-11-08 MED ORDER — BISACODYL 5 MG PO TBEC: 5.0000 mg | DELAYED_RELEASE_TABLET | Freq: Every day | ORAL | Status: DC | PRN

## 2020-11-08 MED ORDER — CEPHALEXIN 500 MG PO CAPS
500.0000 mg | ORAL_CAPSULE | Freq: Four times a day (QID) | ORAL | Status: DC
  Administered 2020-11-08 – 2020-11-09 (×2): 500 mg via ORAL
  Filled 2020-11-08 (×2): qty 1

## 2020-11-08 MED ORDER — ACETAMINOPHEN 500 MG PO TABS
1000.0000 mg | ORAL_TABLET | Freq: Four times a day (QID) | ORAL | Status: DC
  Administered 2020-11-09 (×2): 1000 mg via ORAL
  Filled 2020-11-08 (×2): qty 2

## 2020-11-08 MED ORDER — FAMOTIDINE 20 MG PO TABS
ORAL_TABLET | ORAL | Status: AC
  Administered 2020-11-08: 20 mg via ORAL
  Filled 2020-11-08: qty 1

## 2020-11-08 MED ORDER — ONDANSETRON HCL 4 MG/2ML IJ SOLN
INTRAMUSCULAR | Status: AC
  Filled 2020-11-08: qty 8

## 2020-11-08 MED ORDER — PROPOFOL 10 MG/ML IV BOLUS
INTRAVENOUS | Status: DC | PRN
  Administered 2020-11-08: 50 mg via INTRAVENOUS
  Administered 2020-11-08: 150 mg via INTRAVENOUS

## 2020-11-08 MED ORDER — PROPOFOL 500 MG/50ML IV EMUL
INTRAVENOUS | Status: DC | PRN
  Administered 2020-11-08: 150 ug/kg/min via INTRAVENOUS

## 2020-11-08 MED ORDER — BUPIVACAINE-EPINEPHRINE (PF) 0.5% -1:200000 IJ SOLN
INTRAMUSCULAR | Status: DC | PRN
  Administered 2020-11-08: 20 mL

## 2020-11-08 MED ORDER — SODIUM CHLORIDE 0.9% FLUSH
3.0000 mL | Freq: Two times a day (BID) | INTRAVENOUS | Status: DC
  Administered 2020-11-08: 3 mL via INTRAVENOUS

## 2020-11-08 MED ORDER — VANCOMYCIN HCL 1000 MG IV SOLR
INTRAVENOUS | Status: DC | PRN
  Administered 2020-11-08: 1000 mg via TOPICAL

## 2020-11-08 MED ORDER — FENTANYL CITRATE (PF) 100 MCG/2ML IJ SOLN
INTRAMUSCULAR | Status: DC | PRN
  Administered 2020-11-08: 50 ug via INTRAVENOUS
  Administered 2020-11-08: 100 ug via INTRAVENOUS
  Administered 2020-11-08: 50 ug via INTRAVENOUS

## 2020-11-08 MED ORDER — METHOCARBAMOL 500 MG PO TABS
750.0000 mg | ORAL_TABLET | Freq: Four times a day (QID) | ORAL | Status: DC
  Administered 2020-11-09 (×2): 750 mg via ORAL
  Filled 2020-11-08 (×2): qty 2

## 2020-11-08 MED ORDER — PROPOFOL 10 MG/ML IV BOLUS
INTRAVENOUS | Status: AC
  Filled 2020-11-08: qty 20

## 2020-11-08 MED ORDER — OXYCODONE HCL 5 MG PO TABS
ORAL_TABLET | ORAL | Status: AC
  Filled 2020-11-08: qty 1

## 2020-11-08 MED ORDER — ONDANSETRON HCL 4 MG/2ML IJ SOLN: 4.0000 mg | Freq: Once | INTRAMUSCULAR | Status: DC | PRN

## 2020-11-08 MED ORDER — PROPOFOL 10 MG/ML IV BOLUS
INTRAVENOUS | Status: AC
  Filled 2020-11-08: qty 40

## 2020-11-08 MED ORDER — SUGAMMADEX SODIUM 500 MG/5ML IV SOLN
INTRAVENOUS | Status: DC | PRN
  Administered 2020-11-08: 400 mg via INTRAVENOUS

## 2020-11-08 MED ORDER — DROPERIDOL 2.5 MG/ML IJ SOLN
0.0625 mg | Freq: Once | INTRAMUSCULAR | Status: DC
  Filled 2020-11-08: qty 2

## 2020-11-08 MED ORDER — REMIFENTANIL HCL 1 MG IV SOLR
INTRAVENOUS | Status: AC
  Filled 2020-11-08: qty 1000

## 2020-11-08 MED ORDER — OXYCODONE HCL 5 MG PO TABS
5.0000 mg | ORAL_TABLET | Freq: Four times a day (QID) | ORAL | 0 refills | Status: AC | PRN
Start: 2020-11-08 — End: 2020-11-15

## 2020-11-08 MED ORDER — THROMBIN 5000 UNITS EX SOLR
CUTANEOUS | Status: DC | PRN
  Administered 2020-11-08: 5000 [IU] via TOPICAL

## 2020-11-08 MED ORDER — CEFAZOLIN SODIUM-DEXTROSE 2-4 GM/100ML-% IV SOLN
INTRAVENOUS | Status: AC
  Filled 2020-11-08: qty 100

## 2020-11-08 MED ORDER — MIDAZOLAM HCL 2 MG/2ML IJ SOLN
INTRAMUSCULAR | Status: DC | PRN
  Administered 2020-11-08: 2 mg via INTRAVENOUS

## 2020-11-08 MED ORDER — PRAVASTATIN SODIUM 20 MG PO TABS
20.0000 mg | ORAL_TABLET | Freq: Every day | ORAL | Status: DC
  Administered 2020-11-08 – 2020-11-09 (×2): 20 mg via ORAL
  Filled 2020-11-08 (×2): qty 1

## 2020-11-08 MED ORDER — DEXAMETHASONE SODIUM PHOSPHATE 10 MG/ML IJ SOLN
INTRAMUSCULAR | Status: DC | PRN
  Administered 2020-11-08: 10 mg via INTRAVENOUS

## 2020-11-08 MED ORDER — DOCUSATE SODIUM 100 MG PO CAPS
100.0000 mg | ORAL_CAPSULE | Freq: Two times a day (BID) | ORAL | Status: DC
  Administered 2020-11-08 – 2020-11-09 (×2): 100 mg via ORAL
  Filled 2020-11-08 (×2): qty 1

## 2020-11-08 MED ORDER — CHLORHEXIDINE GLUCONATE 0.12 % MT SOLN
OROMUCOSAL | Status: AC
  Administered 2020-11-08: 15 mL via OROMUCOSAL
  Filled 2020-11-08: qty 15

## 2020-11-08 MED ORDER — ONDANSETRON HCL 4 MG PO TABS
4.0000 mg | ORAL_TABLET | Freq: Four times a day (QID) | ORAL | Status: DC | PRN
  Administered 2020-11-09: 4 mg via ORAL
  Filled 2020-11-08: qty 1

## 2020-11-08 MED ORDER — HYDROMORPHONE HCL 1 MG/ML IJ SOLN
INTRAMUSCULAR | Status: AC
  Administered 2020-11-08: 0.5 mg via INTRAVENOUS
  Filled 2020-11-08: qty 1

## 2020-11-08 MED ORDER — LIDOCAINE HCL (CARDIAC) PF 100 MG/5ML IV SOSY
PREFILLED_SYRINGE | INTRAVENOUS | Status: DC | PRN
  Administered 2020-11-08: 100 mg via INTRATRACHEAL

## 2020-11-08 MED ORDER — FENTANYL CITRATE (PF) 100 MCG/2ML IJ SOLN
INTRAMUSCULAR | Status: AC
  Filled 2020-11-08: qty 2

## 2020-11-08 MED ORDER — LISINOPRIL 2.5 MG PO TABS
2.5000 mg | ORAL_TABLET | Freq: Every day | ORAL | Status: DC
  Administered 2020-11-09: 2.5 mg via ORAL
  Filled 2020-11-08: qty 1

## 2020-11-08 MED ORDER — CEPHALEXIN 500 MG PO CAPS: 500.0000 mg | ORAL_CAPSULE | Freq: Four times a day (QID) | ORAL | 0 refills | Status: AC

## 2020-11-08 MED ORDER — VANCOMYCIN HCL 1000 MG IV SOLR
INTRAVENOUS | Status: AC
  Filled 2020-11-08: qty 1000

## 2020-11-08 MED ORDER — MIDAZOLAM HCL 2 MG/2ML IJ SOLN
INTRAMUSCULAR | Status: AC
  Filled 2020-11-08: qty 2

## 2020-11-08 MED ORDER — INSULIN ASPART 100 UNIT/ML ~~LOC~~ SOLN
0.0000 [IU] | Freq: Three times a day (TID) | SUBCUTANEOUS | Status: DC
  Administered 2020-11-09: 5 [IU] via SUBCUTANEOUS
  Filled 2020-11-08: qty 1

## 2020-11-08 MED ORDER — INSULIN ASPART 100 UNIT/ML ~~LOC~~ SOLN
SUBCUTANEOUS | Status: AC
  Administered 2020-11-08: 8 [IU] via SUBCUTANEOUS
  Filled 2020-11-08: qty 1

## 2020-11-08 MED ORDER — ROCURONIUM BROMIDE 100 MG/10ML IV SOLN
INTRAVENOUS | Status: DC | PRN
  Administered 2020-11-08: 30 mg via INTRAVENOUS

## 2020-11-08 MED ORDER — SODIUM CHLORIDE 0.9% FLUSH: 3.0000 mL | INTRAVENOUS | Status: DC | PRN

## 2020-11-08 MED ORDER — OXYCODONE HCL 5 MG PO TABS
10.0000 mg | ORAL_TABLET | ORAL | Status: DC | PRN
  Administered 2020-11-09: 10 mg via ORAL
  Filled 2020-11-08: qty 2

## 2020-11-08 MED ORDER — BUPIVACAINE-EPINEPHRINE (PF) 0.5% -1:200000 IJ SOLN
INTRAMUSCULAR | Status: AC
  Filled 2020-11-08: qty 30

## 2020-11-08 MED ORDER — FLEET ENEMA 7-19 GM/118ML RE ENEM: 1.0000 | ENEMA | Freq: Once | RECTAL | Status: DC | PRN

## 2020-11-08 MED ORDER — PROPOFOL 500 MG/50ML IV EMUL
INTRAVENOUS | Status: AC
  Filled 2020-11-08: qty 50

## 2020-11-08 MED ORDER — SUCCINYLCHOLINE CHLORIDE 20 MG/ML IJ SOLN
INTRAMUSCULAR | Status: DC | PRN
  Administered 2020-11-08: 120 mg via INTRAVENOUS

## 2020-11-08 MED ORDER — SODIUM CHLORIDE 0.9 % IV SOLN: INTRAVENOUS | Status: DC | PRN

## 2020-11-08 MED ORDER — SODIUM CHLORIDE 0.9 % IV SOLN
INTRAVENOUS | Status: DC | PRN
  Administered 2020-11-08: 25 ug/min via INTRAVENOUS

## 2020-11-08 MED ORDER — ONDANSETRON HCL 4 MG/2ML IJ SOLN: 4.0000 mg | Freq: Four times a day (QID) | INTRAMUSCULAR | Status: DC | PRN

## 2020-11-08 MED ORDER — PHENOL 1.4 % MT LIQD
1.0000 | OROMUCOSAL | Status: DC | PRN
  Filled 2020-11-08: qty 177

## 2020-11-08 MED ORDER — HYDROMORPHONE HCL 1 MG/ML IJ SOLN: 0.5000 mg | INTRAMUSCULAR | Status: DC | PRN

## 2020-11-08 MED ORDER — FENTANYL CITRATE (PF) 100 MCG/2ML IJ SOLN
25.0000 ug | INTRAMUSCULAR | Status: DC | PRN
  Administered 2020-11-08 (×5): 25 ug via INTRAVENOUS

## 2020-11-08 MED ORDER — METHOCARBAMOL 1000 MG/10ML IJ SOLN
500.0000 mg | Freq: Four times a day (QID) | INTRAVENOUS | Status: DC
  Filled 2020-11-08: qty 5

## 2020-11-08 MED ORDER — DEXMEDETOMIDINE (PRECEDEX) IN NS 20 MCG/5ML (4 MCG/ML) IV SYRINGE
PREFILLED_SYRINGE | INTRAVENOUS | Status: AC
  Filled 2020-11-08: qty 10

## 2020-11-08 MED ORDER — OXYCODONE HCL 5 MG PO TABS: 5.0000 mg | ORAL_TABLET | ORAL | Status: DC | PRN

## 2020-11-08 MED ORDER — SODIUM CHLORIDE 0.9 % IV SOLN: 50.0000 mL/h | INTRAVENOUS | Status: DC

## 2020-11-08 MED ORDER — GLYCOPYRROLATE 0.2 MG/ML IJ SOLN
INTRAMUSCULAR | Status: DC | PRN
  Administered 2020-11-08: .2 mg via INTRAVENOUS

## 2020-11-08 MED ORDER — GLYCOPYRROLATE 0.2 MG/ML IJ SOLN
INTRAMUSCULAR | Status: AC
  Filled 2020-11-08: qty 2

## 2020-11-08 MED ORDER — SUCCINYLCHOLINE CHLORIDE 200 MG/10ML IV SOSY
PREFILLED_SYRINGE | INTRAVENOUS | Status: AC
  Filled 2020-11-08: qty 20

## 2020-11-08 MED ORDER — LIDOCAINE HCL (PF) 2 % IJ SOLN
INTRAMUSCULAR | Status: AC
  Filled 2020-11-08: qty 10

## 2020-11-08 MED ORDER — ALUM & MAG HYDROXIDE-SIMETH 200-200-20 MG/5ML PO SUSP: 30.0000 mL | Freq: Four times a day (QID) | ORAL | Status: DC | PRN

## 2020-11-08 MED ORDER — POLYETHYLENE GLYCOL 3350 17 G PO PACK: 17.0000 g | PACK | Freq: Every day | ORAL | Status: DC | PRN

## 2020-11-08 MED ORDER — MENTHOL 3 MG MT LOZG
1.0000 | LOZENGE | OROMUCOSAL | Status: DC | PRN
  Filled 2020-11-08: qty 9

## 2020-11-08 MED ORDER — METHOCARBAMOL 1000 MG/10ML IJ SOLN
500.0000 mg | Freq: Four times a day (QID) | INTRAVENOUS | Status: DC
  Administered 2020-11-08: 500 mg via INTRAVENOUS
  Filled 2020-11-08: qty 5

## 2020-11-08 MED ORDER — ONDANSETRON HCL 4 MG/2ML IJ SOLN
INTRAMUSCULAR | Status: DC | PRN
  Administered 2020-11-08 (×2): 4 mg via INTRAVENOUS

## 2020-11-08 MED ORDER — REMIFENTANIL HCL 1 MG IV SOLR
INTRAVENOUS | Status: DC | PRN
Start: 2020-11-08 — End: 2020-11-08
  Administered 2020-11-08: .1 ug/kg/min via INTRAVENOUS

## 2020-11-08 MED ORDER — ACETAMINOPHEN 10 MG/ML IV SOLN
INTRAVENOUS | Status: DC | PRN
  Administered 2020-11-08: 1000 mg via INTRAVENOUS

## 2020-11-08 MED ORDER — EPHEDRINE SULFATE 50 MG/ML IJ SOLN
INTRAMUSCULAR | Status: DC | PRN
  Administered 2020-11-08: 5 mg via INTRAVENOUS
  Administered 2020-11-08: 10 mg via INTRAVENOUS

## 2020-11-08 MED ORDER — THROMBIN 5000 UNITS EX SOLR
CUTANEOUS | Status: AC
  Filled 2020-11-08: qty 5000

## 2020-11-08 MED ORDER — OXYCODONE HCL 5 MG PO TABS
5.0000 mg | ORAL_TABLET | Freq: Once | ORAL | Status: AC
  Administered 2020-11-08: 5 mg via ORAL

## 2020-11-08 SURGICAL SUPPLY — 84 items
ADH SKN CLS APL DERMABOND .7 (GAUZE/BANDAGES/DRESSINGS) ×1
AGENT HMST MTR 8 SURGIFLO (HEMOSTASIS) ×1
APL PRP STRL LF DISP 70% ISPRP (MISCELLANEOUS) ×1
APL SRG 60D 8 XTD TIP BNDBL (TIP)
BLADE BOVIE TIP EXT 4 (BLADE) ×3 IMPLANT
BUR NEURO DRILL SOFT 3.0X3.8M (BURR) ×3 IMPLANT
CABLE MULTI-LEAD TRIAL SPINE (MISCELLANEOUS) ×4 IMPLANT
CANISTER SUCT 1200ML W/VALVE (MISCELLANEOUS) ×6 IMPLANT
CHLORAPREP W/TINT 26 (MISCELLANEOUS) ×3 IMPLANT
CNTNR SPEC 2.5X3XGRAD LEK (MISCELLANEOUS) ×1
CONT SPEC 4OZ STER OR WHT (MISCELLANEOUS) ×2
CONT SPEC 4OZ STRL OR WHT (MISCELLANEOUS) ×1
CONTAINER SPEC 2.5X3XGRAD LEK (MISCELLANEOUS) ×1 IMPLANT
COUNTER NEEDLE 20/40 LG (NEEDLE) ×3 IMPLANT
COVER LIGHT HANDLE STERIS (MISCELLANEOUS) ×6 IMPLANT
COVER WAND RF STERILE (DRAPES) ×3 IMPLANT
CUP MEDICINE 2OZ PLAST GRAD ST (MISCELLANEOUS) ×3 IMPLANT
DERMABOND ADVANCED (GAUZE/BANDAGES/DRESSINGS) ×2
DERMABOND ADVANCED .7 DNX12 (GAUZE/BANDAGES/DRESSINGS) ×1 IMPLANT
DRAPE C-ARM 42X72 X-RAY (DRAPES) ×6 IMPLANT
DRAPE C-ARMOR (DRAPES) ×2 IMPLANT
DRAPE LAPAROTOMY 100X77 ABD (DRAPES) ×3 IMPLANT
DRAPE MICROSCOPE SPINE 48X150 (DRAPES) IMPLANT
DRAPE SURG 17X11 SM STRL (DRAPES) ×3 IMPLANT
DRSG TEGADERM 2-3/8X2-3/4 SM (GAUZE/BANDAGES/DRESSINGS) ×2 IMPLANT
DRSG TEGADERM 4X4.75 (GAUZE/BANDAGES/DRESSINGS) ×4 IMPLANT
DRSG TEGADERM 6X8 (GAUZE/BANDAGES/DRESSINGS) ×2 IMPLANT
DRSG TELFA 4X3 1S NADH ST (GAUZE/BANDAGES/DRESSINGS) ×2 IMPLANT
DURASEAL APPLICATOR TIP (TIP) IMPLANT
DURASEAL SPINE SEALANT 3ML (MISCELLANEOUS) IMPLANT
ELECT CAUTERY BLADE TIP 2.5 (TIP) ×3
ELECT EZSTD 165MM 6.5IN (MISCELLANEOUS) ×3
ELECT REM PT RETURN 9FT ADLT (ELECTROSURGICAL) ×3
ELECTRODE CAUTERY BLDE TIP 2.5 (TIP) ×1 IMPLANT
ELECTRODE EZSTD 165MM 6.5IN (MISCELLANEOUS) ×1 IMPLANT
ELECTRODE REM PT RTRN 9FT ADLT (ELECTROSURGICAL) ×1 IMPLANT
EXTENSION SPINE 60 (Miscellaneous) ×4 IMPLANT
FEE INTRAOP MONITOR IMPULS NCS (MISCELLANEOUS) IMPLANT
GAUZE SPONGE 4X4 12PLY STRL (GAUZE/BANDAGES/DRESSINGS) ×2 IMPLANT
GLOVE BIOGEL PI IND STRL 7.0 (GLOVE) ×1 IMPLANT
GLOVE BIOGEL PI IND STRL 8 (GLOVE) ×1 IMPLANT
GLOVE BIOGEL PI INDICATOR 7.0 (GLOVE) ×2
GLOVE BIOGEL PI INDICATOR 8 (GLOVE) ×2
GLOVE SURG SYN 7.0 (GLOVE) ×6 IMPLANT
GLOVE SURG SYN 7.0 PF PI (GLOVE) ×2 IMPLANT
GLOVE SURG SYN 8.0 (GLOVE) ×3 IMPLANT
GLOVE SURG SYN 8.0 PF PI (GLOVE) ×1 IMPLANT
GOWN STRL REUS W/ TWL LRG LVL3 (GOWN DISPOSABLE) ×1 IMPLANT
GOWN STRL REUS W/ TWL XL LVL3 (GOWN DISPOSABLE) ×2 IMPLANT
GOWN STRL REUS W/TWL LRG LVL3 (GOWN DISPOSABLE) ×3
GOWN STRL REUS W/TWL XL LVL3 (GOWN DISPOSABLE) ×6
GRADUATE 1200CC STRL 31836 (MISCELLANEOUS) ×3 IMPLANT
INTRAOP MONITOR FEE IMPULS NCS (MISCELLANEOUS) ×1
INTRAOP MONITOR FEE IMPULSE (MISCELLANEOUS) ×3
KIT ACCES W MAGNET X1 BATTERY (MISCELLANEOUS) ×2 IMPLANT
KIT TURNOVER KIT A (KITS) ×3 IMPLANT
LEAD LAMITRODE PENTA 3MM 60CM (Lead) ×2 IMPLANT
MANIFOLD NEPTUNE II (INSTRUMENTS) ×3 IMPLANT
MARKER SKIN DUAL TIP RULER LAB (MISCELLANEOUS) ×3 IMPLANT
Multilead Trial Cable ×4 IMPLANT
NDL SAFETY ECLIPSE 18X1.5 (NEEDLE) ×1 IMPLANT
NEEDLE HYPO 18GX1.5 SHARP (NEEDLE) ×3
NEEDLE HYPO 22GX1.5 SAFETY (NEEDLE) ×3 IMPLANT
NS IRRIG 1000ML POUR BTL (IV SOLUTION) ×3 IMPLANT
PACK LAMINECTOMY NEURO (CUSTOM PROCEDURE TRAY) ×3 IMPLANT
PAD ARMBOARD 7.5X6 YLW CONV (MISCELLANEOUS) ×3 IMPLANT
PENCIL ELECTRO HAND CTR (MISCELLANEOUS) ×2 IMPLANT
SPOGE SURGIFLO 8M (HEMOSTASIS) ×3
SPONGE SURGIFLO 8M (HEMOSTASIS) IMPLANT
STAPLER SKIN PROX 35W (STAPLE) IMPLANT
SUT ETHILON 3-0 FS-10 30 BLK (SUTURE) ×6
SUT POLYSORB 2-0 5X18 GS-10 (SUTURE) ×6 IMPLANT
SUT SILK 2 0 SH (SUTURE) ×6 IMPLANT
SUT VIC AB 0 CT1 18XCR BRD 8 (SUTURE) ×2 IMPLANT
SUT VIC AB 0 CT1 8-18 (SUTURE) ×6
SUTURE EHLN 3-0 FS-10 30 BLK (SUTURE) ×2 IMPLANT
SYR 10ML LL (SYRINGE) ×6 IMPLANT
SYR 20ML LL LF (SYRINGE) ×3 IMPLANT
SYR 30ML LL (SYRINGE) ×6 IMPLANT
SYR 3ML LL SCALE MARK (SYRINGE) ×3 IMPLANT
TOWEL OR 17X26 4PK STRL BLUE (TOWEL DISPOSABLE) ×6 IMPLANT
TUBING CONNECTING 10 (TUBING) ×2 IMPLANT
TUBING CONNECTING 10' (TUBING) ×1
external pulse generator ×2 IMPLANT

## 2020-11-08 NOTE — Anesthesia Procedure Notes (Signed)
Procedure Name: Intubation Performed by: Fletcher-Harrison, Anastasios Melander, CRNA Pre-anesthesia Checklist: Patient identified, Emergency Drugs available, Suction available and Patient being monitored Patient Re-evaluated:Patient Re-evaluated prior to induction Oxygen Delivery Method: Circle system utilized Preoxygenation: Pre-oxygenation with 100% oxygen Induction Type: IV induction Ventilation: Mask ventilation without difficulty Laryngoscope Size: McGraph and 3 Tube type: Oral Tube size: 7.0 mm Number of attempts: 1 Airway Equipment and Method: Stylet and Oral airway Placement Confirmation: ETT inserted through vocal cords under direct vision,  positive ETCO2,  breath sounds checked- equal and bilateral and CO2 detector Secured at: 21 cm Tube secured with: Tape Dental Injury: Teeth and Oropharynx as per pre-operative assessment        

## 2020-11-08 NOTE — Interval H&P Note (Signed)
History and Physical Interval Note:  11/08/2020 8:34 AM  Tara Blevins  has presented today for surgery, with the diagnosis of chronic pain syndrome g89.4.  The various methods of treatment have been discussed with the patient and family. After consideration of risks, benefits and other options for treatment, the patient has consented to  Procedure(s): THORACIC LAMINECTOMY FOR SPINAL CORD STIMULATOR TRIAL (N/A) as a surgical intervention.  The patient's history has been reviewed, patient examined, no change in status, stable for surgery.  I have reviewed the patient's chart and labs.  Questions were answered to the patient's satisfaction.     [name]

## 2020-11-08 NOTE — Progress Notes (Signed)
   11/08/20 0735  Clinical Encounter Type  Visited With Family  Visit Type Initial  Referral From Chaplain  Consult/Referral To Chaplain  While rounding [facility] waiting area, chaplain visited with Pt's significant other and asked if he had any questions or concerns and he said no, he was fine.

## 2020-11-08 NOTE — H&P (View-Only) (Signed)
Tara Blevins is an 54 y.o. female.   Chief Complaint: Back and right leg pain HPI: Tara Blevins is here for evaluation of chronic pain in her back and legs. She did have a previous lumbar decompression done for left leg pain she says that was beneficial. She currently now is dealing with more right leg pain will go down the posterior side down to the calf and into the foot. She does get some tingling sensations in bilateral feet. She does have diabetes but feels this is well controlled. She has been undergoing injections and therapy in the past without any additional help. She more recently did undergo a spinal cord stimulator trial percutaneously but could not tolerate the procedure. This was aborted and then she is referred to me for further treatment options. She has had a recent MRI of the thoracic and lumbar spine.  We discussed a laminectomy for paddle trial and she agrees   Past Medical History:  Diagnosis Date  . Back pain   . Bilateral nephrolithiasis 09/21/2017   noted on spine xray  . DDD (degenerative disc disease), thoracolumbar 09/21/2017   T12-L1, L2-L3, noted on Spine Xray , shoulders  . Diabetes mellitus without complication (HCC)   . Fatty liver   . Heart murmur   . High cholesterol   . History of kidney stones   . Hypertension   . Kidney stone   . kidney stones     Past Surgical History:  Procedure Laterality Date  . ABDOMINAL HYSTERECTOMY    . ADENOIDECTOMY    . BACK SURGERY    . COLONOSCOPY    . CYSTOSCOPY WITH RETROGRADE PYELOGRAM, URETEROSCOPY AND STENT PLACEMENT Left 02/07/2018   Procedure: CYSTOSCOPY WITH RETROGRADE PYELOGRAM, URETEROSCOPY STENT EXCHANGE, STONE BASKETRY;  Surgeon: McKenzie, Patrick L, MD;  Location: [facility];  Service: Urology;  Laterality: Left;  . CYSTOSCOPY/RETROGRADE/URETEROSCOPY Left 01/24/2018   Procedure: CYSTOSCOPY/LEFT RETROGRADE/LEFT URETEROSCOPY AND LEFT STENT PLACEMENT;  Surgeon: McKenzie, Patrick L, MD;  Location:  [facility];  Service: Urology;  Laterality: Left;  . HOLMIUM LASER APPLICATION Left 02/07/2018   Procedure: HOLMIUM LASER APPLICATION;  Surgeon: McKenzie, Patrick L, MD;  Location: [facility];  Service: Urology;  Laterality: Left;  . LUMBAR LAMINECTOMY  07/2017  . OOPHORECTOMY    . TONSILLECTOMY      Family History  Problem Relation Age of Onset  . Heart disease Father    Social History:  reports that she has never smoked. She has never used smokeless tobacco. She reports that she does not drink alcohol and does not use drugs.  Allergies:  Allergies  Allergen Reactions  . Tomato Hives  . Bupropion Anxiety    Medications Prior to Admission  Medication Sig Dispense Refill  . aspirin EC 81 MG tablet Take 81 mg by mouth daily.    . glipiZIDE (GLUCOTROL XL) 10 MG 24 hr tablet Take 10 mg by mouth daily.    . lisinopril (ZESTRIL) 5 MG tablet Take 2.5 mg by mouth daily.     . metFORMIN (GLUCOPHAGE) 500 MG tablet Take 1,000 mg by mouth daily with breakfast.     . mometasone (ELOCON) 0.1 % cream Apply 1 application topically 2 (two) times a week.    . pravastatin (PRAVACHOL) 20 MG tablet Take 20 mg by mouth daily.     . Vitamin D, Ergocalciferol, (DRISDOL) 50000 units CAPS capsule Take 50,000 Units by mouth every 7 (seven) days.        Results for orders placed or performed during the hospital encounter of 11/08/20 (from the past 48 hour(s))  Glucose, capillary     Status: Abnormal   Collection Time: 11/08/20  7:50 AM  Result Value Ref Range   Glucose-Capillary 156 (H) 70 - 99 mg/dL    Comment: Glucose reference range applies only to samples taken after fasting for at least 8 hours.   No results found.  Review of Systems General ROS: Negative Psychological ROS: Negative Ophthalmic ROS: Negative ENT ROS: Negative Hematological and Lymphatic ROS: Negative  Endocrine ROS: Negative Respiratory ROS: Negative Cardiovascular ROS: Negative Gastrointestinal  ROS: Negative Genito-Urinary ROS: Negative Musculoskeletal ROS: Positive for back pain Neurological ROS: Positive for right leg pain Dermatological ROS: Negative   Blood pressure 138/78, pulse 71, temperature 98 F (36.7 C), temperature source Oral, resp. rate 18, height 5' (1.524 m), weight 81.2 kg, SpO2 100 %. Physical Exam  General appearance: Alert, cooperative, in no acute distress Head: Normocephalic, atraumatic Eyes: Normal, EOM intact Oropharynx: Moist without lesions Back: Tenderness to palpation of the midline and paramedian regions Ext: Mild edema lower extremities  Neurologic exam:  Mental status: alertness: alert,affect: normal Speech: fluent and clear Motor:strength symmetric 5/5 in bilateral hip flexion, knee flexion, knee extension with dorsiflexion plantarflexion Sensory: intact to light touch in bilateral lower extremities Gait: normal   Imaging: MRI lumbar spine: There is a normal without a curvature. There is multilevel degenerative disease most prominent from L2-L5 with disc degeneration and disc bulging. At these levels, there is left sided foraminal and lateral recess stenosis without any significant right-sided stenosis. There is no obvious central stenosis. There is a good alignment.  MRI thoracic spine: There is multilevel mild degenerative disease with no significant stenosis noted from the levels of T9-T11.   Assessment/Plan Proceed with laminectomy for SCS trial  [name], MD 11/08/2020, 8:25 AM

## 2020-11-08 NOTE — H&P (Signed)
Tara Blevins is an 54 y.o. female.   Chief Complaint: Back and right leg pain HPI: Tara Blevins is here for evaluation of chronic pain in her back and legs. She did have a previous lumbar decompression done for left leg pain she says that was beneficial. She currently now is dealing with more right leg pain will go down the posterior side down to the calf and into the foot. She does get some tingling sensations in bilateral feet. She does have diabetes but feels this is well controlled. She has been undergoing injections and therapy in the past without any additional help. She more recently did undergo a spinal cord stimulator trial percutaneously but could not tolerate the procedure. This was aborted and then she is referred to me for further treatment options. She has had a recent MRI of the thoracic and lumbar spine.  We discussed a laminectomy for paddle trial and she agrees   Past Medical History:  Diagnosis Date  . Back pain   . Bilateral nephrolithiasis 09/21/2017   noted on spine xray  . DDD (degenerative disc disease), thoracolumbar 09/21/2017   T12-L1, L2-L3, noted on Spine Xray , shoulders  . Diabetes mellitus without complication (HCC)   . Fatty liver   . Heart murmur   . High cholesterol   . History of kidney stones   . Hypertension   . Kidney stone   . kidney stones     Past Surgical History:  Procedure Laterality Date  . ABDOMINAL HYSTERECTOMY    . ADENOIDECTOMY    . BACK SURGERY    . COLONOSCOPY    . CYSTOSCOPY WITH RETROGRADE PYELOGRAM, URETEROSCOPY AND STENT PLACEMENT Left 02/07/2018   Procedure: CYSTOSCOPY WITH RETROGRADE PYELOGRAM, URETEROSCOPY STENT EXCHANGE, STONE BASKETRY;  Surgeon: Malen Gauze, MD;  Location: [facility];  Service: Urology;  Laterality: Left;  . CYSTOSCOPY/RETROGRADE/URETEROSCOPY Left 01/24/2018   Procedure: CYSTOSCOPY/LEFT RETROGRADE/LEFT URETEROSCOPY AND LEFT STENT PLACEMENT;  Surgeon: Malen Gauze, MD;  Location:  [facility];  Service: Urology;  Laterality: Left;  . HOLMIUM LASER APPLICATION Left 02/07/2018   Procedure: HOLMIUM LASER APPLICATION;  Surgeon: Malen Gauze, MD;  Location: [facility];  Service: Urology;  Laterality: Left;  . LUMBAR LAMINECTOMY  07/2017  . OOPHORECTOMY    . TONSILLECTOMY      Family History  Problem Relation Age of Onset  . Heart disease Father    Social History:  reports that she has never smoked. She has never used smokeless tobacco. She reports that she does not drink alcohol and does not use drugs.  Allergies:  Allergies  Allergen Reactions  . Tomato Hives  . Bupropion Anxiety    Medications Prior to Admission  Medication Sig Dispense Refill  . aspirin EC 81 MG tablet Take 81 mg by mouth daily.    Marland Kitchen glipiZIDE (GLUCOTROL XL) 10 MG 24 hr tablet Take 10 mg by mouth daily.    Marland Kitchen lisinopril (ZESTRIL) 5 MG tablet Take 2.5 mg by mouth daily.     . metFORMIN (GLUCOPHAGE) 500 MG tablet Take 1,000 mg by mouth daily with breakfast.     . mometasone (ELOCON) 0.1 % cream Apply 1 application topically 2 (two) times a week.    . pravastatin (PRAVACHOL) 20 MG tablet Take 20 mg by mouth daily.     . Vitamin D, Ergocalciferol, (DRISDOL) 50000 units CAPS capsule Take 50,000 Units by mouth every 7 (seven) days.  Results for orders placed or performed during the hospital encounter of 11/08/20 (from the past 48 hour(s))  Glucose, capillary     Status: Abnormal   Collection Time: 11/08/20  7:50 AM  Result Value Ref Range   Glucose-Capillary 156 (H) 70 - 99 mg/dL    Comment: Glucose reference range applies only to samples taken after fasting for at least 8 hours.   No results found.  Review of Systems General ROS: Negative Psychological ROS: Negative Ophthalmic ROS: Negative ENT ROS: Negative Hematological and Lymphatic ROS: Negative  Endocrine ROS: Negative Respiratory ROS: Negative Cardiovascular ROS: Negative Gastrointestinal  ROS: Negative Genito-Urinary ROS: Negative Musculoskeletal ROS: Positive for back pain Neurological ROS: Positive for right leg pain Dermatological ROS: Negative   Blood pressure 138/78, pulse 71, temperature 98 F (36.7 C), temperature source Oral, resp. rate 18, height 5' (1.524 m), weight 81.2 kg, SpO2 100 %. Physical Exam  General appearance: Alert, cooperative, in no acute distress Head: Normocephalic, atraumatic Eyes: Normal, EOM intact Oropharynx: Moist without lesions Back: Tenderness to palpation of the midline and paramedian regions Ext: Mild edema lower extremities  Neurologic exam:  Mental status: alertness: alert,affect: normal Speech: fluent and clear Motor:strength symmetric 5/5 in bilateral hip flexion, knee flexion, knee extension with dorsiflexion plantarflexion Sensory: intact to light touch in bilateral lower extremities Gait: normal   Imaging: MRI lumbar spine: There is a normal without a curvature. There is multilevel degenerative disease most prominent from L2-L5 with disc degeneration and disc bulging. At these levels, there is left sided foraminal and lateral recess stenosis without any significant right-sided stenosis. There is no obvious central stenosis. There is a good alignment.  MRI thoracic spine: There is multilevel mild degenerative disease with no significant stenosis noted from the levels of T9-T11.   Assessment/Plan Proceed with laminectomy for SCS trial  [name], MD 11/08/2020, 8:25 AM

## 2020-11-08 NOTE — Transfer of Care (Signed)
Immediate Anesthesia Transfer of Care Note  Patient: [name]  Procedure(s) Performed: THORACIC LAMINECTOMY FOR SPINAL CORD STIMULATOR TRIAL (N/A )  Patient Location: PACU  Anesthesia Type:General  Level of Consciousness: drowsy, patient cooperative and responds to stimulation  Airway & Oxygen Therapy: Patient Spontanous Breathing and Patient connected to face mask oxygen  Post-op Assessment: Report given to RN and Post -op Vital signs reviewed and stable  Post vital signs: Reviewed and stable  Last Vitals:  Vitals Value Taken Time  BP 137/73 11/08/20 1214  Temp 35.9 C 11/08/20 1214  Pulse 86 11/08/20 1219  Resp 14 11/08/20 1219  SpO2 99 % 11/08/20 1219  Vitals shown include unvalidated device data.  Last Pain:  Vitals:   11/08/20 1214  TempSrc:   PainSc: Asleep         Complications: No complications documented.

## 2020-11-08 NOTE — Op Note (Signed)
Operative Note SURGERY DATE:11/08/2020  PRE-OP DIAGNOSIS: Chronic pain syndrome  POST-OP DIAGNOSIS:Post-Op Diagnosis Codes: Chronic pain syndrome  Procedure(s) with comments: Thoracic spinal cord stimulator trial placement via laminectomy  SURGEON:  * Nathaniel Man, MD [name] - assistant   ANESTHESIA:GETA  OPERATIVE FINDINGS:Successful placement of thoracic spinal cord stimulatorpaddle trial with externalization of wire  Indication Ms. Bellwas seen in clinic on11/9with ongoing back and right leg pain. MRI of the spine revealed no concerning stenosis at the level of the implant.  She had failed at attempt to place leads percutaneously, therefore paddle trial via laminectomy was discussed. Risks including weakness, hematoma, infection, failure of pain relief, post-operative pain, stroke, heart attack, pneumonia, and spinal cord injury were discussed.We discussed using neuro monitoring to prevent any neurologic deficits.  She wanted to proceed with the trial.   Procedure The patient was brought to the operating room where vascular access was obtained andshe was intubated by the anesthesia service. Neuro monitoring electrodes were placed and baseline MEP's and SSEPs were normal. She wasplaced prone on a Wilson frame. Antibiotics were given.Fluoroscopy was used to confirm planned incision in mid thoracic area. In addition, an incision was planned in the right flank for the pulse generator placement. The patient was prepped and draped in a sterile fashion. A hard time out was performed. Local anesthetic was instilled intoplannedincisionsites.   The midline thoracic incision was opened and taken to the fascia using cautery.the spinous process and lamina of T9-10 were exposed.  Fluoroscopy confirmed the level.  The drill was used to remove the inferior portion of the T9 lamina and superior portion of T10.  The ligament was removed exposing  the dura.  Hemostasis was achieved.  The paddle was placed gently in the rostral direction ensuring no undue pressure on the dura. There was resistance at the top of T9. Given this, we elected to start more caudal for entry position and a small laminotomy was performed at T10/11. There the ligament was removed and dura exposed. The paddle was then able to be passed gently in rostral direction to the top of T9, in midline.    Once the paddle was successfully placed in midline, the level was confirmed with fluoroscopy.  We used the rogrammerand monitoringto ensure these were covering the patients areas of painin the right leg.The leads were secured with anchors in the fascia. The incision was irrigated and hemostasis obtained. Monitoring confirmed no change in potentials  Next, the right flank incision was opened and taken to the fascia and inferior dissection to allow for a large enough pocket for the placement of the battery. The incision was irrigated and hemostasis obtained. Next, a tunneler was used to pass the electrode leads from the lumbar incision to the right flank. There, the electrodes were attached to extension leads.  The extension leads were then tunneled to the left flank to exit the skin.  There they were secured with nylon. A final fluoroscopic image was taken show good placement of the paddle.Vancomycin powder was placed into the incisions. Next, acombinationof 0 and 2-0 Vicryls were used to close the incisions with 3-0 nylon on the skin.  Sterile dressings were applied. The patient was returned to supine positionand the patient was seen to be moving all extremities symmetrically and was taken to PACU for recovery. The family was updated and all questions answered.   ESTIMATED BLOOD LOSS: 20cc  SPECIMENS None  IMPLANT PENTA 72mm LEAD, 60cm  Inventory Item:  Serial no.: [id number] Model/Cat  no.: [id number]  Implant name: PENTA 24mm LEAD, 60cm Laterality: N/A Area:  Spine Thoracic  Manufacturer: [employer] Date of Manufacture:    Action: Implanted Number Used: 1   Device Identifier:  Device Identifier Type:    extensin, 60cm  Inventory Item:  Serial no.: [id number] Model/Cat no.: [id number]  Implant name: extensin, 60cm Laterality: N/A Area: Spine Thoracic  Manufacturer: [employer] Date of Manufacture:    Action: Implanted Number Used: 1   Device Identifier:  Device Identifier Type:    extension, 60cm  Inventory Item:  Serial no.: [id number] Model/Cat no.: [id number]  Implant name: extension, 60cm Laterality: N/A Area: Spine Thoracic  Manufacturer: [employer] Date of Manufacture:    Action: Implanted Number Used: 1   Device Identifier:  Device Identifier Type:        I performed the case in its entiretywithassistance of Christine Zdeb,NP  [name], MD [phone]

## 2020-11-08 NOTE — Discharge Summary (Signed)
Procedure: SCS trial Procedure date: 11/08/2020 Diagnosis: chronic pain syndrome    History: Tara Blevins is s/p insertion of paddle lead for SCS trial POD1: Admitted overnight for pain management. This morning she is alert, oriented, continues to complain of upper back spasms but improved since yesterday.   POD0: Tolerated procedure well. Evaluated in post op recovery still disoriented from anesthesia but able to answer questions and obey commands.   Physical Exam: Vitals:   11/08/20 2034 11/09/20 0735  BP: 108/87 (!) 110/59  Pulse: 65 (!) 57  Resp: 17 16  Temp: (!) 97.4 F (36.3 C) 98.4 F (36.9 C)  SpO2: 99% 100%    General: Alert and oriented, standing  At side of bed Strength:5/5 throughout  Sensation: intact and symmetric throughout  Skin: incisions covered with sterile OR dressing, C/D/I  Data:  Recent Labs  Lab 11/02/20 1005  NA 140  K 4.0  CL 103  CO2 28  BUN 20  CREATININE 1.04*  GLUCOSE 129*  CALCIUM 9.3   No results for input(s): AST, ALT, ALKPHOS in the last 168 hours.  Invalid input(s): TBILI   Recent Labs  Lab 11/02/20 1005  WBC 7.2  HGB 13.3  HCT 40.8  PLT 295   Recent Labs  Lab 11/02/20 1005  APTT 26  INR 1.1         Assessment/Plan:  Tara Blevins is POD1 s/p insertion of SCS paddle lead for trial.   She is appropriate for discharge to home. We will follow up with her at the end of the week regarding the next steps.  She should hold her Aspirin, leave dressings in place and not shower.  She should continue her antibiotic (Keflex).   [name], NP Department of Neurosurgery

## 2020-11-08 NOTE — Anesthesia Preprocedure Evaluation (Signed)
Anesthesia Evaluation  Patient identified by MRN, date of birth, ID band Patient awake    Reviewed: Allergy & Precautions, H&P , NPO status , Patient's Chart, lab work & pertinent test results, reviewed documented beta blocker date and time   Airway Mallampati: II  TM Distance: >3 FB Neck ROM: full    Dental  (+) Teeth Intact   Pulmonary neg pulmonary ROS,    Pulmonary exam normal        Cardiovascular Exercise Tolerance: Good hypertension, On Medications Normal cardiovascular exam+ Valvular Problems/Murmurs  Rhythm:regular Rate:Normal     Neuro/Psych negative neurological ROS  negative psych ROS   GI/Hepatic negative GI ROS, Neg liver ROS,   Endo/Other  negative endocrine ROSdiabetes, Well Controlled  Renal/GU Renal disease  negative genitourinary   Musculoskeletal   Abdominal   Peds  Hematology negative hematology ROS (+)   Anesthesia Other Findings Past Medical History: No date: Back pain 09/21/2017: Bilateral nephrolithiasis     Comment:  noted on spine xray 09/21/2017: DDD (degenerative disc disease), thoracolumbar     Comment:  T12-L1, L2-L3, noted on Spine Xray , shoulders No date: Diabetes mellitus without complication (HCC) No date: Fatty liver No date: Heart murmur No date: High cholesterol No date: History of kidney stones No date: Hypertension No date: Kidney stone No date: kidney stones Past Surgical History: No date: ABDOMINAL HYSTERECTOMY No date: ADENOIDECTOMY No date: BACK SURGERY No date: COLONOSCOPY 02/07/2018: CYSTOSCOPY WITH RETROGRADE PYELOGRAM, URETEROSCOPY AND  STENT PLACEMENT; Left     Comment:  Procedure: CYSTOSCOPY WITH RETROGRADE PYELOGRAM,               URETEROSCOPY STENT EXCHANGE, STONE BASKETRY;  Surgeon:               Malen Gauze, MD;  Location: [facility];  Service: Urology;  Laterality: Left; 01/24/2018:  CYSTOSCOPY/RETROGRADE/URETEROSCOPY; Left     Comment:  Procedure: CYSTOSCOPY/LEFT RETROGRADE/LEFT URETEROSCOPY               AND LEFT STENT PLACEMENT;  Surgeon: Malen Gauze,               MD;  Location: [facility];  Service:               Urology;  Laterality: Left; 02/07/2018: HOLMIUM LASER APPLICATION; Left     Comment:  Procedure: HOLMIUM LASER APPLICATION;  Surgeon:               Malen Gauze, MD;  Location: [facility];  Service: Urology;  Laterality: Left; 07/2017: LUMBAR LAMINECTOMY No date: OOPHORECTOMY No date: TONSILLECTOMY BMI    Body Mass Index: 34.96 kg/m     Reproductive/Obstetrics negative OB ROS                             Anesthesia Physical Anesthesia Plan  ASA: II  Anesthesia Plan: General ETT   Post-op Pain Management:    Induction:   PONV Risk Score and Plan:   Airway Management Planned:   Additional Equipment:   Intra-op Plan:   Post-operative Plan:   Informed Consent: I have reviewed the patients History and Physical, chart, labs and discussed the procedure including the risks, benefits and alternatives for the proposed anesthesia with  the patient or authorized representative who has indicated his/her understanding and acceptance.     Dental Advisory Given  Plan Discussed with: CRNA  Anesthesia Plan Comments:         Anesthesia Quick Evaluation

## 2020-11-08 NOTE — Interval H&P Note (Signed)
History and Physical Interval Note:  11/08/2020 8:27 AM  Tara Blevins  has presented today for surgery, with the diagnosis of chronic pain syndrome g89.4.  The various methods of treatment have been discussed with the patient and family. After consideration of risks, benefits and other options for treatment, the patient has consented to  Procedure(s): THORACIC LAMINECTOMY FOR SPINAL CORD STIMULATOR TRIAL (N/A) as a surgical intervention.  The patient's history has been reviewed, patient examined, no change in status, stable for surgery.  I have reviewed the patient's chart and labs.  Questions were answered to the patient's satisfaction.     [name]

## 2020-11-09 ENCOUNTER — Encounter: Payer: Self-pay | Admitting: Neurosurgery

## 2020-11-09 DIAGNOSIS — G894 Chronic pain syndrome: Secondary | ICD-10-CM | POA: Diagnosis not present

## 2020-11-09 LAB — GLUCOSE, CAPILLARY: Glucose-Capillary: 248 mg/dL — ABNORMAL HIGH (ref 70–99)

## 2020-11-09 MED ORDER — BISACODYL 5 MG PO TBEC: 5.0000 mg | DELAYED_RELEASE_TABLET | Freq: Every day | ORAL | 0 refills | Status: DC | PRN

## 2020-11-09 MED ORDER — SENNA 8.6 MG PO TABS
2.0000 | ORAL_TABLET | Freq: Two times a day (BID) | ORAL | 0 refills | Status: DC
Start: 2020-11-09 — End: 2023-07-24

## 2020-11-09 MED ORDER — LIDOCAINE 5 % EX PTCH
1.0000 | MEDICATED_PATCH | CUTANEOUS | Status: DC
  Administered 2020-11-09: 1 via TRANSDERMAL
  Filled 2020-11-09: qty 1

## 2020-11-09 MED ORDER — POLYETHYLENE GLYCOL 3350 17 G PO PACK: 17.0000 g | PACK | Freq: Every day | ORAL | 0 refills | Status: DC | PRN

## 2020-11-09 MED ORDER — METHOCARBAMOL 750 MG PO TABS
750.0000 mg | ORAL_TABLET | Freq: Four times a day (QID) | ORAL | 0 refills | Status: DC
Start: 2020-11-09 — End: 2023-03-10

## 2020-11-09 MED ORDER — DOCUSATE SODIUM 100 MG PO CAPS
100.0000 mg | ORAL_CAPSULE | Freq: Two times a day (BID) | ORAL | 0 refills | Status: DC
Start: 2020-11-09 — End: 2024-05-19

## 2020-11-09 MED ORDER — ACETAMINOPHEN 500 MG PO TABS: 1000.0000 mg | ORAL_TABLET | Freq: Three times a day (TID) | ORAL | 0 refills | Status: AC | PRN

## 2020-11-09 NOTE — Progress Notes (Signed)
Patient is stable and ready for discharge. Patient's IV removed. Patient is discharging home. Writer went over discharge paperwork and educated on no showers, BLT, not removing dressings until seen in MD office, she verbalized understanding and had no questions. Patient was able to pack her belongings. NT is transporting patient via WC to private car to go home.

## 2020-11-09 NOTE — Discharge Instructions (Signed)
KEEP THE DRESSINGS IN PLACE. DO NOT SHOWER. DO NOT RESTART ASPIRIN.  We have prescribed an antibiotic for you. Please take it as ordered.   Your surgeon has performed an operation on your spine for a spinal cord stimulator trial.  Many times, patients feel better immediately after surgery and can "overdo it." Even if you feel well, it is important that you follow these activity guidelines. If you do not let your back heal properly from the surgery, you can increase the chance of a disc herniation and/or return of your symptoms. The following are instructions to help in your recovery once you have been discharged from the hospital.  * Do not take anti-inflammatory medications for 3 days after surgery (naproxen [Aleve], ibuprofen [Advil, Motrin], celecoxib [Celebrex], etc.)  Activity    No bending, lifting, or twisting ("BLT"). Avoid lifting objects heavier than 10 pounds (gallon milk jug).  Where possible, avoid household activities that involve lifting, bending, pushing, or pulling such as laundry, vacuuming, grocery shopping, and childcare. Try to arrange for help from friends and family for these activities while your back heals.  Increase physical activity slowly as tolerated.  Taking short walks is encouraged, but avoid strenuous exercise. Do not jog, run, bicycle, lift weights, or participate in any other exercises unless specifically allowed by your doctor. Avoid prolonged sitting, including car rides.  Talk to your doctor before resuming sexual activity.  You should not drive until cleared by your doctor.  Until released by your doctor, you should not return to work or school.  You should rest at home and let your body heal.   You may shower two days after your surgery.  After showering, lightly dab your incision dry. Do not take a tub bath or go swimming for 3 weeks, or until approved by your doctor at your follow-up appointment.  If you smoke, we strongly recommend that you quit.   Smoking has been proven to interfere with normal healing in your back and will dramatically reduce the success rate of your surgery. Please contact [facility] (800-QUIT-NOW) and use the resources at www.[facility].com for assistance in stopping smoking.  Surgical Incision   Keep the dressings in place until you see [place]. DO NOT REMOVE the dressings.   Diet            You may return to your usual diet. Be sure to stay hydrated.  When to Contact us  Although your surgery and recovery will likely be uneventful, you may have some residual numbness, aches, and pains in your back and/or legs. This is normal and should improve in the next few weeks.  However, should you experience any of the following, contact us immediately: . New numbness or weakness . Pain that is progressively getting worse, and is not relieved by your pain medications or rest . Bleeding, redness, swelling, pain, or drainage from surgical incision . Chills or flu-like symptoms . Fever greater than 101.0 F (38.3 C) . Problems with bowel or bladder functions . Difficulty breathing or shortness of breath . Warmth, tenderness, or swelling in your calf  Contact Information . During office hours (Monday-Friday 9 am to 5 pm), please call your physician at [phone] . After hours and weekends, please call [phone] and an answering service will put you in touch with either Dr. Adriana Simas or Dr. Myer Haff.  . For a life-threatening emergency, call 911   AMBULATORY SURGERY  DISCHARGE INSTRUCTIONS   1) The drugs that you were given will stay in your  system until tomorrow so for the next 24 hours you should not:  A) Drive an automobile B) Make any legal decisions C) Drink any alcoholic beverage   2) You may resume regular meals tomorrow.  Today it is better to start with liquids and gradually work up to solid foods.  You may eat anything you prefer, but it is better to start with liquids, then soup and crackers, and  gradually work up to solid foods.   3) Please notify your doctor immediately if you have any unusual bleeding, trouble breathing, redness and pain at the surgery site, drainage, fever, or pain not relieved by medication.    4) Additional Instructions:        Please contact your physician with any problems or Same Day Surgery at [phone], Monday through Friday 6 am to 4 pm, or [place] at [facility] number at [phone].

## 2020-11-10 NOTE — Anesthesia Postprocedure Evaluation (Signed)
Anesthesia Post Note  Patient: Tara Blevins  Procedure(s) Performed: THORACIC LAMINECTOMY FOR SPINAL CORD STIMULATOR TRIAL (N/A )  Patient location during evaluation: PACU Anesthesia Type: General Level of consciousness: awake and alert Pain management: pain level controlled Vital Signs Assessment: post-procedure vital signs reviewed and stable Respiratory status: spontaneous breathing, nonlabored ventilation, respiratory function stable and patient connected to nasal cannula oxygen Cardiovascular status: blood pressure returned to baseline and stable Postop Assessment: no apparent nausea or vomiting Anesthetic complications: no   No complications documented.   Last Vitals:  Vitals:   11/08/20 2034 11/09/20 0735  BP: 108/87 (!) 110/59  Pulse: 65 (!) 57  Resp: 17 16  Temp: (!) 36.3 C 36.9 C  SpO2: 99% 100%    Last Pain:  Vitals:   11/09/20 0944  TempSrc:   PainSc: 8                  Yevette Edwards

## 2020-11-12 ENCOUNTER — Other Ambulatory Visit: Payer: Self-pay

## 2020-11-12 ENCOUNTER — Other Ambulatory Visit
Admission: RE | Admit: 2020-11-12 | Discharge: 2020-11-12 | Disposition: A | Discharge status: Home / Self Care | Payer: BLUE CROSS/BLUE SHIELD | Source: Home / Self Care | Attending: Neurosurgery | Admitting: Neurosurgery

## 2020-11-12 DIAGNOSIS — G894 Chronic pain syndrome: Secondary | ICD-10-CM | POA: Diagnosis not present

## 2020-11-12 LAB — SARS CORONAVIRUS 2 (TAT 6-24 HRS): SARS Coronavirus 2: NEGATIVE

## 2020-11-15 ENCOUNTER — Ambulatory Visit: Payer: BLUE CROSS/BLUE SHIELD | Admitting: Urgent Care

## 2020-11-15 ENCOUNTER — Encounter
Admission: RE | Disposition: A | Discharge status: Home / Self Care | Payer: Self-pay | Source: Home / Self Care | Attending: Neurosurgery

## 2020-11-15 ENCOUNTER — Ambulatory Visit
Admission: RE | Admit: 2020-11-15 | Discharge: 2020-11-15 | Disposition: A | Discharge status: Home / Self Care | Payer: BLUE CROSS/BLUE SHIELD | Attending: Neurosurgery | Admitting: Neurosurgery

## 2020-11-15 ENCOUNTER — Encounter: Payer: Self-pay | Admitting: Neurosurgery

## 2020-11-15 ENCOUNTER — Other Ambulatory Visit: Payer: Self-pay

## 2020-11-15 DIAGNOSIS — Z7984 Long term (current) use of oral hypoglycemic drugs: Secondary | ICD-10-CM | POA: Insufficient documentation

## 2020-11-15 DIAGNOSIS — Z888 Allergy status to other drugs, medicaments and biological substances status: Secondary | ICD-10-CM | POA: Diagnosis not present

## 2020-11-15 DIAGNOSIS — Z7982 Long term (current) use of aspirin: Secondary | ICD-10-CM | POA: Diagnosis not present

## 2020-11-15 DIAGNOSIS — Z79899 Other long term (current) drug therapy: Secondary | ICD-10-CM | POA: Insufficient documentation

## 2020-11-15 DIAGNOSIS — G894 Chronic pain syndrome: Secondary | ICD-10-CM | POA: Diagnosis not present

## 2020-11-15 DIAGNOSIS — Z4542 Encounter for adjustment and management of neuropacemaker (brain) (peripheral nerve) (spinal cord): Secondary | ICD-10-CM | POA: Diagnosis present

## 2020-11-15 HISTORY — PX: PULSE GENERATOR IMPLANT: SHX5370

## 2020-11-15 LAB — POCT I-STAT, CHEM 8
BUN: 20 mg/dL (ref 6–20)
Calcium, Ion: 0.9 mmol/L — ABNORMAL LOW (ref 1.15–1.40)
Chloride: 106 mmol/L (ref 98–111)
Creatinine, Ser: 1 mg/dL (ref 0.44–1.00)
Glucose, Bld: 204 mg/dL — ABNORMAL HIGH (ref 70–99)
HCT: 41 % (ref 36.0–46.0)
Hemoglobin: 13.9 g/dL (ref 12.0–15.0)
Potassium: 4.2 mmol/L (ref 3.5–5.1)
Sodium: 136 mmol/L (ref 135–145)
TCO2: 23 mmol/L (ref 22–32)

## 2020-11-15 LAB — GLUCOSE, CAPILLARY: Glucose-Capillary: 203 mg/dL — ABNORMAL HIGH (ref 70–99)

## 2020-11-15 SURGERY — UNILATERAL PULSE GENERATOR IMPLANT
Anesthesia: General

## 2020-11-15 MED ORDER — FAMOTIDINE 20 MG PO TABS: 20.0000 mg | ORAL_TABLET | Freq: Once | ORAL | Status: AC

## 2020-11-15 MED ORDER — OXYCODONE HCL 5 MG PO TABS
ORAL_TABLET | ORAL | Status: AC
  Filled 2020-11-15: qty 1

## 2020-11-15 MED ORDER — ACETAMINOPHEN 10 MG/ML IV SOLN: 1000.0000 mg | Freq: Once | INTRAVENOUS | Status: DC | PRN

## 2020-11-15 MED ORDER — LIDOCAINE HCL (PF) 2 % IJ SOLN
INTRAMUSCULAR | Status: AC
  Filled 2020-11-15: qty 5

## 2020-11-15 MED ORDER — CEFAZOLIN SODIUM-DEXTROSE 2-4 GM/100ML-% IV SOLN
2.0000 g | INTRAVENOUS | Status: AC
  Administered 2020-11-15: 2 g via INTRAVENOUS

## 2020-11-15 MED ORDER — PHENYLEPHRINE HCL (PRESSORS) 10 MG/ML IV SOLN
INTRAVENOUS | Status: DC | PRN
  Administered 2020-11-15: 100 ug via INTRAVENOUS

## 2020-11-15 MED ORDER — ONDANSETRON HCL 4 MG/2ML IJ SOLN
INTRAMUSCULAR | Status: DC | PRN
  Administered 2020-11-15: 4 mg via INTRAVENOUS

## 2020-11-15 MED ORDER — FENTANYL CITRATE (PF) 100 MCG/2ML IJ SOLN
INTRAMUSCULAR | Status: AC
  Filled 2020-11-15: qty 2

## 2020-11-15 MED ORDER — OXYCODONE HCL 5 MG/5ML PO SOLN: 5.0000 mg | Freq: Once | ORAL | Status: AC | PRN

## 2020-11-15 MED ORDER — FENTANYL CITRATE (PF) 100 MCG/2ML IJ SOLN: 25.0000 ug | INTRAMUSCULAR | Status: DC | PRN

## 2020-11-15 MED ORDER — FENTANYL CITRATE (PF) 100 MCG/2ML IJ SOLN
INTRAMUSCULAR | Status: DC | PRN
  Administered 2020-11-15 (×2): 50 ug via INTRAVENOUS

## 2020-11-15 MED ORDER — BUPIVACAINE-EPINEPHRINE (PF) 0.5% -1:200000 IJ SOLN
INTRAMUSCULAR | Status: DC | PRN
  Administered 2020-11-15: 30 mL

## 2020-11-15 MED ORDER — FAMOTIDINE 20 MG PO TABS
ORAL_TABLET | ORAL | Status: AC
  Administered 2020-11-15: 20 mg via ORAL
  Filled 2020-11-15: qty 1

## 2020-11-15 MED ORDER — KETOROLAC TROMETHAMINE 30 MG/ML IJ SOLN
INTRAMUSCULAR | Status: DC | PRN
  Administered 2020-11-15: 15 mg via INTRAVENOUS

## 2020-11-15 MED ORDER — ONDANSETRON HCL 4 MG/2ML IJ SOLN
INTRAMUSCULAR | Status: AC
  Filled 2020-11-15: qty 2

## 2020-11-15 MED ORDER — ONDANSETRON HCL 4 MG/2ML IJ SOLN: 4.0000 mg | Freq: Once | INTRAMUSCULAR | Status: DC | PRN

## 2020-11-15 MED ORDER — DEXMEDETOMIDINE HCL 200 MCG/2ML IV SOLN
INTRAVENOUS | Status: DC | PRN
  Administered 2020-11-15 (×5): 4 ug via INTRAVENOUS

## 2020-11-15 MED ORDER — OXYCODONE HCL 5 MG PO TABS
5.0000 mg | ORAL_TABLET | Freq: Once | ORAL | Status: AC | PRN
  Administered 2020-11-15: 5 mg via ORAL

## 2020-11-15 MED ORDER — LIDOCAINE HCL (CARDIAC) PF 100 MG/5ML IV SOSY
PREFILLED_SYRINGE | INTRAVENOUS | Status: DC | PRN
  Administered 2020-11-15: 80 mg via INTRAVENOUS

## 2020-11-15 MED ORDER — KETOROLAC TROMETHAMINE 30 MG/ML IJ SOLN
INTRAMUSCULAR | Status: AC
  Filled 2020-11-15: qty 1

## 2020-11-15 MED ORDER — PROPOFOL 10 MG/ML IV BOLUS
INTRAVENOUS | Status: DC | PRN
  Administered 2020-11-15: 20 mg via INTRAVENOUS

## 2020-11-15 MED ORDER — ORAL CARE MOUTH RINSE: 15.0000 mL | Freq: Once | OROMUCOSAL | Status: AC

## 2020-11-15 MED ORDER — CHLORHEXIDINE GLUCONATE 0.12 % MT SOLN
OROMUCOSAL | Status: AC
  Administered 2020-11-15: 15 mL via OROMUCOSAL
  Filled 2020-11-15: qty 15

## 2020-11-15 MED ORDER — PROPOFOL 500 MG/50ML IV EMUL
INTRAVENOUS | Status: DC | PRN
  Administered 2020-11-15: 25 ug/kg/min via INTRAVENOUS

## 2020-11-15 MED ORDER — MIDAZOLAM HCL 2 MG/2ML IJ SOLN
INTRAMUSCULAR | Status: DC | PRN
  Administered 2020-11-15: 2 mg via INTRAVENOUS

## 2020-11-15 MED ORDER — PROPOFOL 500 MG/50ML IV EMUL
INTRAVENOUS | Status: AC
  Filled 2020-11-15: qty 50

## 2020-11-15 MED ORDER — SODIUM CHLORIDE 0.9 % IV SOLN: INTRAVENOUS | Status: DC

## 2020-11-15 MED ORDER — VANCOMYCIN HCL 1 G IV SOLR
INTRAVENOUS | Status: DC | PRN
  Administered 2020-11-15: 1000 mg via TOPICAL

## 2020-11-15 MED ORDER — PROPOFOL 10 MG/ML IV BOLUS
INTRAVENOUS | Status: AC
  Filled 2020-11-15: qty 40

## 2020-11-15 MED ORDER — CEFAZOLIN SODIUM-DEXTROSE 2-4 GM/100ML-% IV SOLN
INTRAVENOUS | Status: AC
  Filled 2020-11-15: qty 100

## 2020-11-15 MED ORDER — PHENYLEPHRINE HCL (PRESSORS) 10 MG/ML IV SOLN
INTRAVENOUS | Status: AC
  Filled 2020-11-15: qty 1

## 2020-11-15 MED ORDER — CHLORHEXIDINE GLUCONATE 0.12 % MT SOLN: 15.0000 mL | Freq: Once | OROMUCOSAL | Status: AC

## 2020-11-15 MED ORDER — PROPOFOL 10 MG/ML IV BOLUS
INTRAVENOUS | Status: AC
  Filled 2020-11-15: qty 20

## 2020-11-15 MED ORDER — MIDAZOLAM HCL 2 MG/2ML IJ SOLN
INTRAMUSCULAR | Status: AC
  Filled 2020-11-15: qty 2

## 2020-11-15 SURGICAL SUPPLY — 59 items
ADH SKN CLS APL DERMABOND .7 (GAUZE/BANDAGES/DRESSINGS)
APL PRP STRL LF DISP 70% ISPRP (MISCELLANEOUS) ×2
BUR NEURO DRILL SOFT 3.0X3.8M (BURR) ×2 IMPLANT
CANISTER SUCT 1200ML W/VALVE (MISCELLANEOUS) ×2 IMPLANT
CHLORAPREP W/TINT 26 (MISCELLANEOUS) ×4 IMPLANT
COUNTER NEEDLE 20/40 LG (NEEDLE) ×2 IMPLANT
COVER LIGHT HANDLE STERIS (MISCELLANEOUS) ×4 IMPLANT
COVER WAND RF STERILE (DRAPES) ×2 IMPLANT
CUP MEDICINE 2OZ PLAST GRAD ST (MISCELLANEOUS) ×2 IMPLANT
DERMABOND ADVANCED (GAUZE/BANDAGES/DRESSINGS)
DERMABOND ADVANCED .7 DNX12 (GAUZE/BANDAGES/DRESSINGS) IMPLANT
DRAPE C-ARMOR (DRAPES) IMPLANT
DRAPE INCISE IOBAN 66X45 STRL (DRAPES) ×2 IMPLANT
DRAPE LAPAROTOMY 100X77 ABD (DRAPES) ×1 IMPLANT
DRAPE LAPAROTOMY 77X122 PED (DRAPES) ×2 IMPLANT
DRAPE SURG 17X11 SM STRL (DRAPES) ×2 IMPLANT
DRSG TEGADERM 4X4.75 (GAUZE/BANDAGES/DRESSINGS) IMPLANT
DRSG TELFA 4X3 1S NADH ST (GAUZE/BANDAGES/DRESSINGS) ×1 IMPLANT
ELECT CAUTERY BLADE TIP 2.5 (TIP) ×2
ELECT REM PT RETURN 9FT ADLT (ELECTROSURGICAL) ×2
ELECTRODE CAUTERY BLDE TIP 2.5 (TIP) ×1 IMPLANT
ELECTRODE REM PT RTRN 9FT ADLT (ELECTROSURGICAL) ×1 IMPLANT
FEE INTRAOP MONITOR IMPULS NCS (MISCELLANEOUS) IMPLANT
GAUZE SPONGE 4X4 12PLY STRL (GAUZE/BANDAGES/DRESSINGS) ×2 IMPLANT
GENERATOR PULSE PROCLAIM 5ELIT (Neuro Prosthesis/Implant) IMPLANT
GLOVE BIOGEL PI IND STRL 7.0 (GLOVE) ×1 IMPLANT
GLOVE BIOGEL PI INDICATOR 7.0 (GLOVE) ×1
GLOVE INDICATOR 8.0 STRL GRN (GLOVE) ×2 IMPLANT
GLOVE SURG SYN 7.0 (GLOVE) ×4 IMPLANT
GLOVE SURG SYN 7.0 PF PI (GLOVE) ×2 IMPLANT
GLOVE SURG SYN 8.0 (GLOVE) ×4 IMPLANT
GLOVE SURG SYN 8.0 PF PI (GLOVE) ×2 IMPLANT
GOWN STRL REUS W/ TWL LRG LVL3 (GOWN DISPOSABLE) ×1 IMPLANT
GOWN STRL REUS W/ TWL XL LVL3 (GOWN DISPOSABLE) ×2 IMPLANT
GOWN STRL REUS W/TWL LRG LVL3 (GOWN DISPOSABLE) ×2
GOWN STRL REUS W/TWL XL LVL3 (GOWN DISPOSABLE) ×4
GRADUATE 1200CC STRL 31836 (MISCELLANEOUS) ×2 IMPLANT
INTRAOP MONITOR FEE IMPULS NCS (MISCELLANEOUS)
INTRAOP MONITOR FEE IMPULSE (MISCELLANEOUS)
KIT TURNOVER KIT A (KITS) ×2 IMPLANT
KIT WILSON FRAME (KITS) ×1 IMPLANT
MANIFOLD NEPTUNE II (INSTRUMENTS) ×2 IMPLANT
MARKER SKIN DUAL TIP RULER LAB (MISCELLANEOUS) ×2 IMPLANT
NEEDLE HYPO 22GX1.5 SAFETY (NEEDLE) ×2 IMPLANT
NS IRRIG 1000ML POUR BTL (IV SOLUTION) ×2 IMPLANT
PACK LAMINECTOMY NEURO (CUSTOM PROCEDURE TRAY) ×2 IMPLANT
PAD ARMBOARD 7.5X6 YLW CONV (MISCELLANEOUS) ×2 IMPLANT
PROCLAIM PATIENT PROGRAMMER ×1 IMPLANT
PROGRAMMER DBS W/MAGNET NMRI (MISCELLANEOUS) ×1 IMPLANT
PULSE GENERATOR PROCLAIM 5ELIT (Neuro Prosthesis/Implant) ×2 IMPLANT
SUT ETHILON 3-0 FS-10 30 BLK (SUTURE) ×2
SUT POLYSORB 2-0 5X18 GS-10 (SUTURE) ×6 IMPLANT
SUT SILK 2 0 PERMA HAND 18 BK (SUTURE) IMPLANT
SUT SILK 2 0 SH (SUTURE) ×2 IMPLANT
SUT VIC AB 0 CT1 18XCR BRD 8 (SUTURE) IMPLANT
SUT VIC AB 0 CT1 8-18 (SUTURE) ×2
SUTURE EHLN 3-0 FS-10 30 BLK (SUTURE) IMPLANT
SYR BULB IRRIG 60ML STRL (SYRINGE) ×2 IMPLANT
TOWEL OR 17X26 4PK STRL BLUE (TOWEL DISPOSABLE) ×4 IMPLANT

## 2020-11-15 NOTE — Transfer of Care (Signed)
Immediate Anesthesia Transfer of Care Note  Patient: Tara Blevins  Procedure(s) Performed: PULSE GENERATOR IMPLANT (N/A )  Patient Location: PACU  Anesthesia Type:General  Level of Consciousness: awake, drowsy and patient cooperative  Airway & Oxygen Therapy: Patient Spontanous Breathing  Post-op Assessment: Report given to RN and Post -op Vital signs reviewed and stable  Post vital signs: Reviewed and stable  Last Vitals:  Vitals Value Taken Time  BP 95/55 11/15/20 1116  Temp    Pulse 73 11/15/20 1119  Resp 15 11/15/20 1119  SpO2 96 % 11/15/20 1119  Vitals shown include unvalidated device data.  Last Pain:  Vitals:   11/15/20 0738  TempSrc: Tympanic  PainSc: 10-Worst pain ever         Complications: No complications documented.

## 2020-11-15 NOTE — Anesthesia Preprocedure Evaluation (Signed)
Anesthesia Evaluation  Patient identified by MRN, date of birth, ID band Patient awake    Reviewed: Allergy & Precautions, H&P , NPO status , Patient's Chart, lab work & pertinent test results, reviewed documented beta blocker date and time   History of Anesthesia Complications Negative for: history of anesthetic complications  Airway Mallampati: II  TM Distance: >3 FB Neck ROM: full    Dental  (+) Teeth Intact   Pulmonary neg pulmonary ROS, neg sleep apnea, neg COPD, Patient abstained from smoking.Not current smoker,    Pulmonary exam normal breath sounds clear to auscultation       Cardiovascular Exercise Tolerance: Good METShypertension, On Medications (-) CAD and (-) Past MI Normal cardiovascular exam(-) dysrhythmias (-) Valvular Problems/Murmurs Rhythm:regular Rate:Normal - Systolic murmurs    Neuro/Psych Chronic back pain, s/p thoracic stimulator. Opioids on and off.  Neuromuscular disease negative psych ROS   GI/Hepatic negative GI ROS, Neg liver ROS, neg GERD  ,  Endo/Other  diabetes, Well Controlled  Renal/GU Renal diseaseKidney stones in past  negative genitourinary   Musculoskeletal  (+) Arthritis ,   Abdominal   Peds  Hematology negative hematology ROS (+)   Anesthesia Other Findings Past Medical History: No date: Back pain 09/21/2017: Bilateral nephrolithiasis     Comment:  noted on spine xray 09/21/2017: DDD (degenerative disc disease), thoracolumbar     Comment:  T12-L1, L2-L3, noted on Spine Xray , shoulders No date: Diabetes mellitus without complication (HCC) No date: Fatty liver No date: Heart murmur No date: High cholesterol No date: History of kidney stones No date: Hypertension No date: Kidney stone No date: kidney stones Past Surgical History: No date: ABDOMINAL HYSTERECTOMY No date: ADENOIDECTOMY No date: BACK SURGERY No date: COLONOSCOPY 02/07/2018: CYSTOSCOPY WITH RETROGRADE  PYELOGRAM, URETEROSCOPY AND  STENT PLACEMENT; Left     Comment:  Procedure: CYSTOSCOPY WITH RETROGRADE PYELOGRAM,               URETEROSCOPY STENT EXCHANGE, STONE BASKETRY;  Surgeon:               Malen Gauze, MD;  Location: [facility];  Service: Urology;  Laterality: Left; 01/24/2018: CYSTOSCOPY/RETROGRADE/URETEROSCOPY; Left     Comment:  Procedure: CYSTOSCOPY/LEFT RETROGRADE/LEFT URETEROSCOPY               AND LEFT STENT PLACEMENT;  Surgeon: Malen Gauze,               MD;  Location: [facility];  Service:               Urology;  Laterality: Left; 02/07/2018: HOLMIUM LASER APPLICATION; Left     Comment:  Procedure: HOLMIUM LASER APPLICATION;  Surgeon:               Malen Gauze, MD;  Location: [facility];  Service: Urology;  Laterality: Left; 07/2017: LUMBAR LAMINECTOMY No date: OOPHORECTOMY No date: TONSILLECTOMY BMI    Body Mass Index: 34.96 kg/m     Reproductive/Obstetrics negative OB ROS                             Anesthesia Physical  Anesthesia Plan  ASA: II  Anesthesia Plan: General   Post-op Pain Management:    Induction: Intravenous  PONV Risk Score and Plan: 4 or greater and Ondansetron, Propofol infusion, TIVA, Midazolam and Dexamethasone  Airway Management Planned: Natural Airway  Additional Equipment: None  Intra-op Plan:   Post-operative Plan:   Informed Consent: I have reviewed the patients History and Physical, chart, labs and discussed the procedure including the risks, benefits and alternatives for the proposed anesthesia with the patient or authorized representative who has indicated his/her understanding and acceptance.     Dental Advisory Given  Plan Discussed with: CRNA  Anesthesia Plan Comments: (Discussed risks of anesthesia with patient, including possibility of difficulty with spontaneous ventilation under anesthesia  necessitating airway intervention, PONV, and rare risks such as cardiac or respiratory or neurological events. Patient understands.)        Anesthesia Quick Evaluation

## 2020-11-15 NOTE — Op Note (Signed)
Operative Note  SURGERY DATE:11/15/2020  PRE-OP DIAGNOSIS: Chronic Pain Syndrome(G89.4)  POST-OP DIAGNOSIS:Chronic Pain Syndrome(G89.4)  Procedure(s) with comments: Placement Right Flank Pulse Generator Removal Trial lead extension  SURGEON:  Nathaniel Man, MD  ANESTHESIA:IV anesthetic, local  OPERATIVE FINDINGS:Successful Spinal Cord Stimulator Trial  Indications: Tara Blevins underwent a SCS trial placement on 11/22 and she noted over the last week that she had almost complete relief of her back and leg pain. Given this, she wished to proceed with pulse generator placement. The risks including hematoma, infection, damage to spinal cord stimulator wires, failure of relief of pain, and need for revision surgery were discussed. The patient elected to proceed with the surgery  Procedure: The patient was brought to the operating room and placed prone on a standard OR table. The anesthesia service had vascular access and provided sedation medications to the patient. The right flank incision and left side exit site was prepped and draped in a sterile fashion. A hard timeout was performed. Local anesthetic was instilled into the incision. The sutures were removed.  The right flank incision was opened bluntly to expose the wires and all sutures removed. The leads were seen to be intact. The leads were removed from the extension connectors and then these were cut and the exiting leads removed from field. The stimulator leads were cleaned.   Hemostasis was obtained and the battery pocket was irrigated with saline. Electrodes were attached to the pulse generator and interrogated. The impedences were found to be normal. Electrodes were secured and tightened. The battery was placed into the pocket in antibiotic pouch and secured with a 2-0 silk suture. Vancomycin powder was placed into the pocket.  The deep tissue was closed with 0 Vicryl and subcutaneous with  2-0 Vicryl. The skin was closed with 3-0 Nylon. A dressing was applied. A 3-0 nylon suture was placed at the left exit site. Patient was turned to the supine position and sedation medications were stopped and the patient was awoken from anesthesia. Patient was taken to the PACU for further recovery and seen to be at neurologic baseline. I discussed the results with the family        ESTIMATED BLOOD LOSS: 20cc  IMPLANT  PULSE GENERATOR PROCLAIM 5ELIT - [id number]  Inventory Item: PULSE GENERATOR PROCLAIM 5ELIT Serial no.: [id number] Model/Cat no.: [id number]  Implant name: PULSE GENERATOR PROCLAIM 5ELIT - [id number] Laterality: N/A Area: Back   Manufacturer: [employer] Date of Manufacture:    Action: Implanted Number Used: 1   Device Identifier:  Device Identifier Type:       I performed the case in its entirety  [name], MD [phone]

## 2020-11-15 NOTE — Anesthesia Postprocedure Evaluation (Signed)
Anesthesia Post Note  Patient: Tara Blevins  Procedure(s) Performed: PULSE GENERATOR IMPLANT (N/A )  Patient location during evaluation: PACU Anesthesia Type: General Level of consciousness: awake and alert and oriented Pain management: pain level controlled Vital Signs Assessment: post-procedure vital signs reviewed and stable Respiratory status: spontaneous breathing, nonlabored ventilation and respiratory function stable Cardiovascular status: blood pressure returned to baseline and stable Postop Assessment: no signs of nausea or vomiting Anesthetic complications: no   No complications documented.   Last Vitals:  Vitals:   11/15/20 1146 11/15/20 1158  BP: (!) 100/56 (!) 122/59  Pulse: 61 70  Resp: 12 16  Temp: 36.4 C (!) 36.4 C  SpO2: 100% 98%    Last Pain:  Vitals:   11/15/20 1205  TempSrc:   PainSc: 5                  Darold Miley

## 2020-11-15 NOTE — Interval H&P Note (Signed)
History and Physical Interval Note:  Patient had SCS placed last week and is doing well with 80% relief, we will proceed with battery placement    11/15/2020 9:41 AM  Tara Blevins  has presented today for surgery, with the diagnosis of chronic pain syndrome g89.4.  The various methods of treatment have been discussed with the patient and family. After consideration of risks, benefits and other options for treatment, the patient has consented to  Procedure(s): PULSE GENERATOR IMPLANT (N/A) as a surgical intervention.  The patient's history has been reviewed, patient examined, no change in status, stable for surgery.  I have reviewed the patient's chart and labs.  Questions were answered to the patient's satisfaction.     Tara Blevins

## 2020-11-15 NOTE — Discharge Instructions (Addendum)
NEUROSURGERY DISCHARGE INSTRUCTIONS  Admission diagnosis: chronic pain syndrome g89.4  Operative procedure: Stimulator battery placement  What to do after you leave the hospital:  Recommended diet: regular diet. Increase protein intake to promote wound healing.  Recommended activity: activity as tolerated. No driving for 6 weeks.  Special Instructions  No straining, no heavy lifting > 10lbs x 2 weeks.  Keep incision area clean and dry.May shower starting tomorrow.  No bath or soaking in tub for6weeks.  Sutures to be removed in 14 days.   Please Report any of the following: Nausea or Vomiting, Temperature is greater than 101.62F (38.1C) degrees, Dizziness, Difficulty Breathing or Shortness of Breath, Inability to Eat, drink Fluids, or Take medications, Bleeding, swelling, or drainage from surgical incision sites, New numbness or weakness and Bowel or bladder dysfunction to the neurosurgery resident on-call at [phone].  Additional Follow up appointments  12/14 @ 1045 in [facility]    AMBULATORY SURGERY  DISCHARGE INSTRUCTIONS   1) The drugs that you were given will stay in your system until tomorrow so for the next 24 hours you should not:  A) Drive an automobile B) Make any legal decisions C) Drink any alcoholic beverage   2) You may resume regular meals tomorrow.  Today it is better to start with liquids and gradually work up to solid foods.  You may eat anything you prefer, but it is better to start with liquids, then soup and crackers, and gradually work up to solid foods.   3) Please notify your doctor immediately if you have any unusual bleeding, trouble breathing, redness and pain at the surgery site, drainage, fever, or pain not relieved by medication.    4) Additional Instructions:        Please contact your physician with any problems or Same Day Surgery at [phone], Monday through Friday 6 am to 4 pm, or [place] at [facility] number at  [phone].

## 2021-10-27 IMAGING — CT CT RENAL STONE PROTOCOL
2 of 4 series · 16 of 46 positions shown, 18 images · non-contrast
Comparison: CT of the abdomen pelvis dated 11/14/2016.

CLINICAL DATA: 53-year-old female with left lower quadrant
abdominal pain.

EXAM:
CT ABDOMEN AND PELVIS WITHOUT CONTRAST
TECHNIQUE: Multidetector CT imaging of the abdomen and pelvis was performed
following the standard protocol without IV contrast.

[Series 3: renal stone 5.0 · axial · 0.86mm/px · z∈[+885,+1275]mm · 13 of 86 slices shown, 15 images]
[im 4/86  soft-tissue]
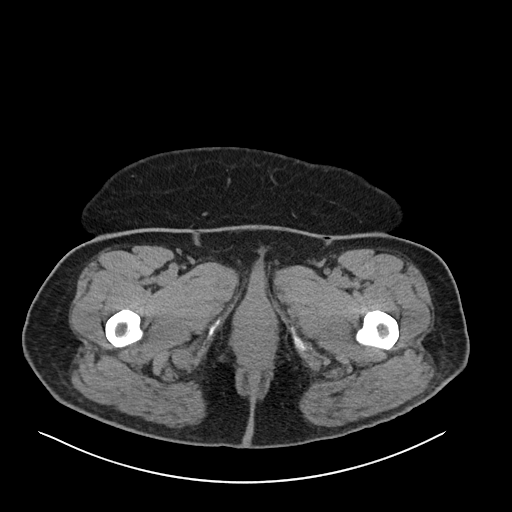
[im 4/86  bone]
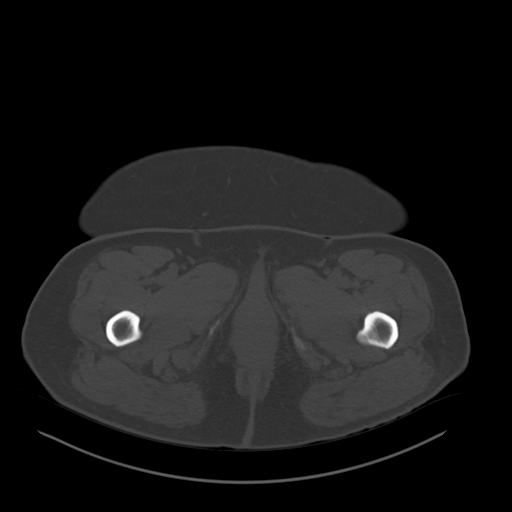
[im 11/86  soft-tissue]
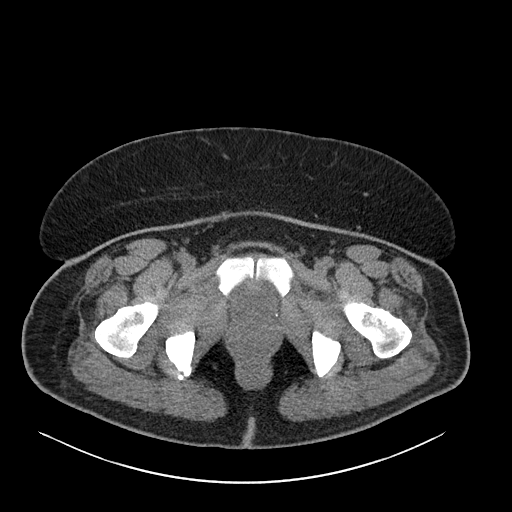
[im 18/86  soft-tissue]
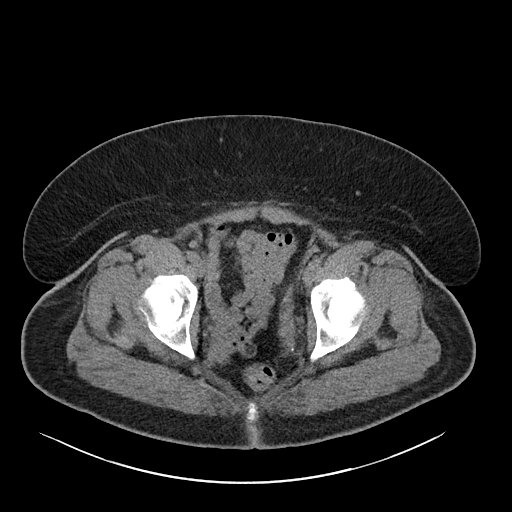
[im 24/86  soft-tissue]
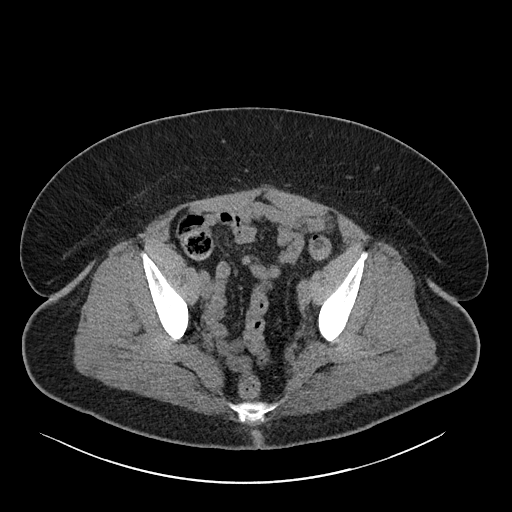
[im 31/86  soft-tissue]
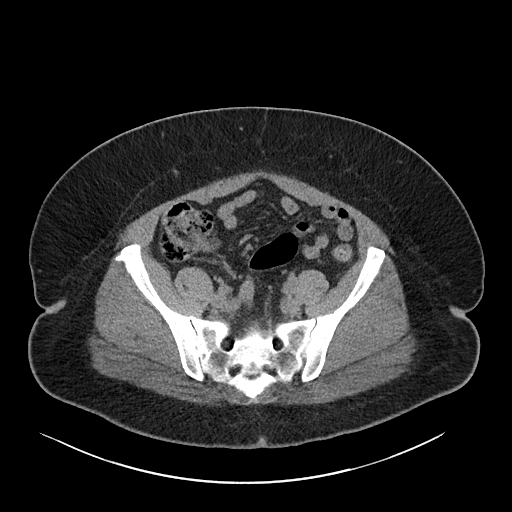
[im 38/86  soft-tissue]
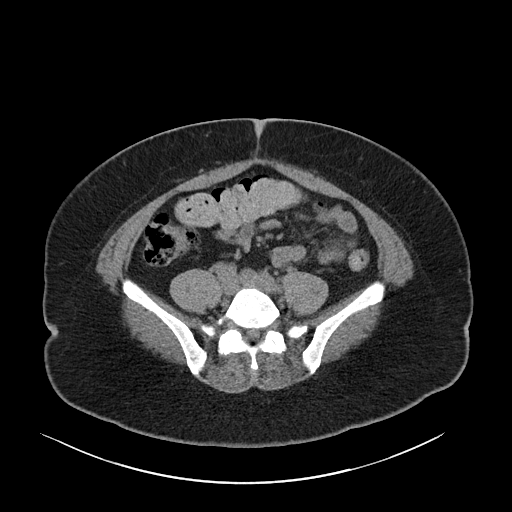
[im 45/86  soft-tissue]
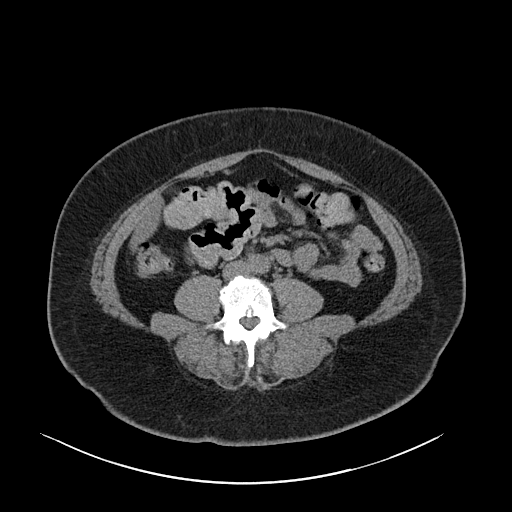
[im 48/86  soft-tissue]
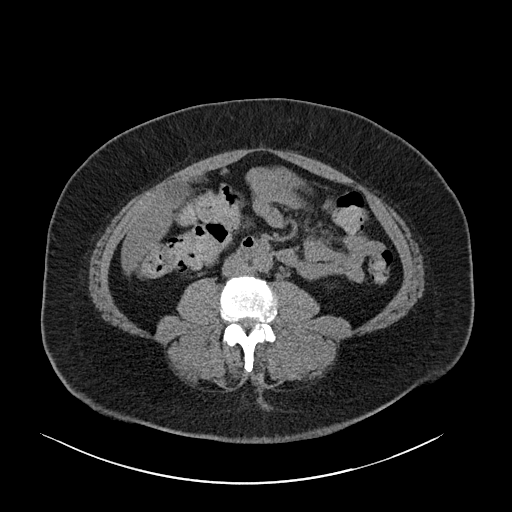
[im 55/86  soft-tissue]
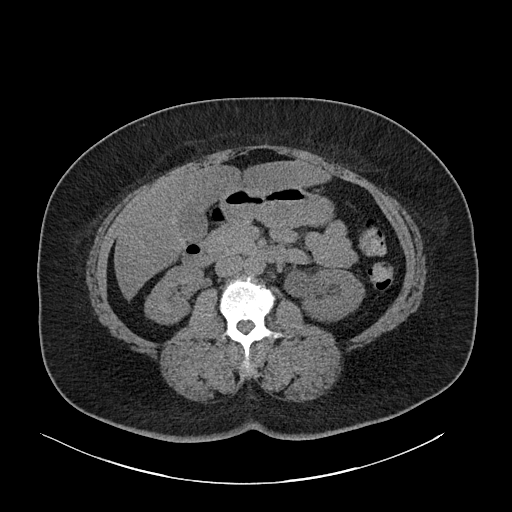
[im 55/86  bone]
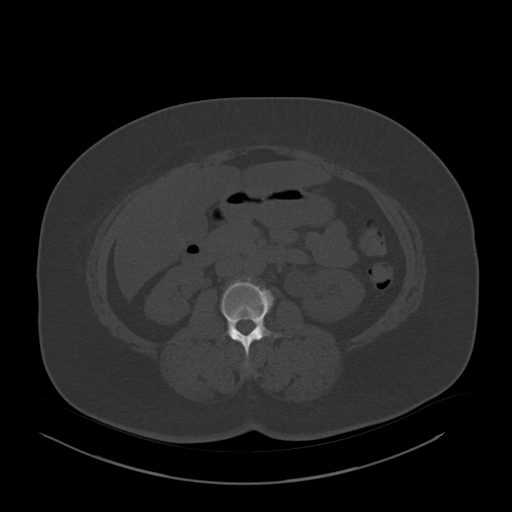
[im 62/86  soft-tissue]
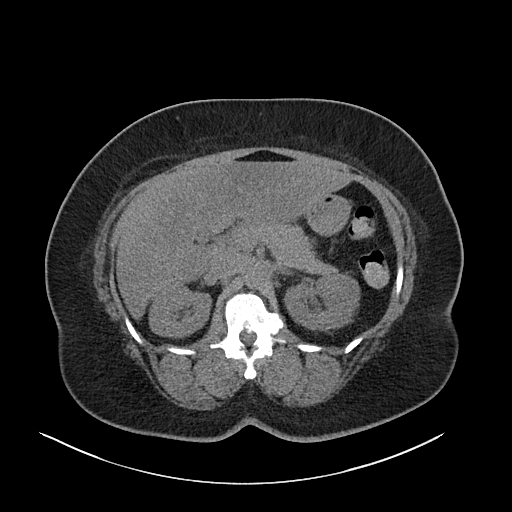
[im 69/86  soft-tissue]
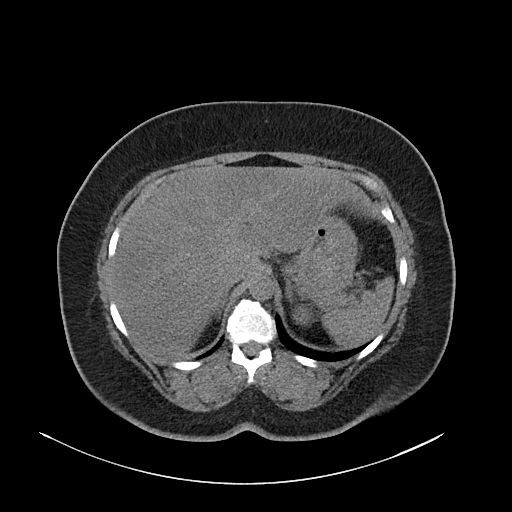
[im 75/86  soft-tissue]
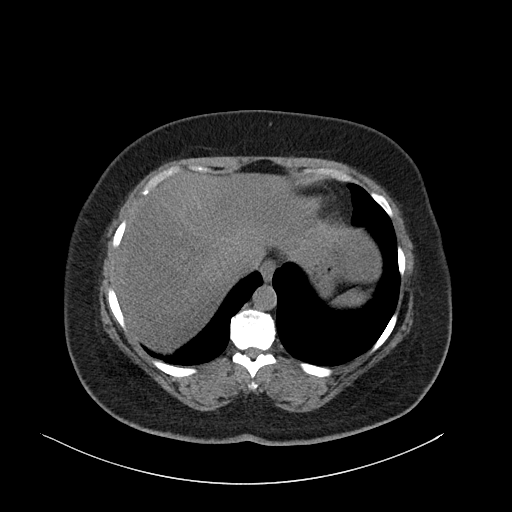
[im 82/86  soft-tissue]
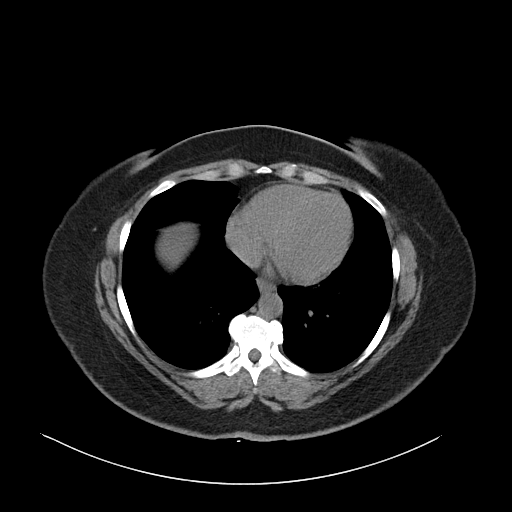

[Series 5: renal stone 3.0 cor · coronal · 0.79mm/px · 3 of 101 slices shown]
[im 34/101  soft-tissue]
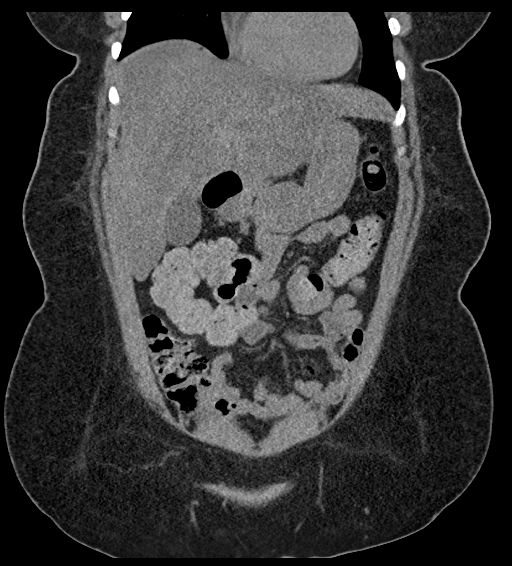
[im 45/101  soft-tissue]
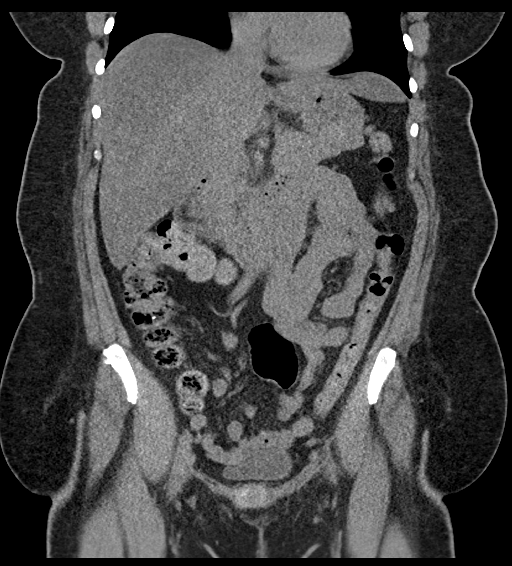
[im 56/101  soft-tissue]
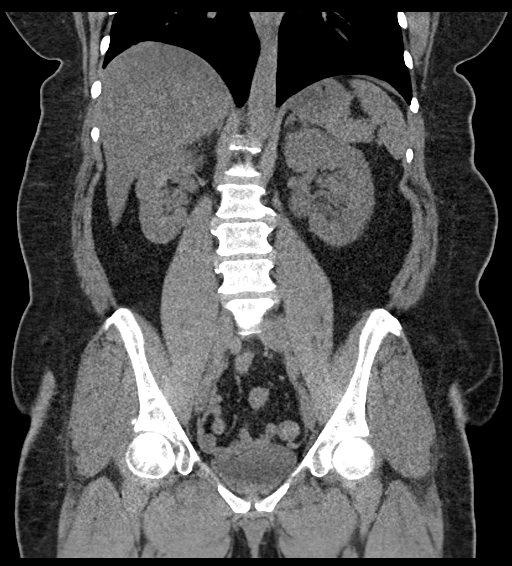

[16 of 46 positions shown; findings below may reference images not displayed]

FINDINGS: Evaluation of this exam is limited in the absence of intravenous
contrast.

Lower chest: The visualized lung bases are clear.

No intra-abdominal free air or free fluid.

Hepatobiliary: Diffuse fatty infiltration of the liver. No
intrahepatic biliary ductal dilatation. The gallbladder is
unremarkable.

Pancreas: Unremarkable. No pancreatic ductal dilatation or
surrounding inflammatory changes.

Spleen: Normal in size without focal abnormality.

Adrenals/Urinary Tract: The adrenal glands are unremarkable. There
is a 5 mm stone at the left ureteropelvic junction with mild left
hydronephrosis. Additional punctate nonobstructing bilateral renal
calculi noted. There is no hydronephrosis or obstructing stone on
the right. The urinary bladder is grossly unremarkable.

Stomach/Bowel: There is no bowel obstruction or active inflammation.
The appendix is normal.

Vascular/Lymphatic: The abdominal aorta and IVC are grossly
unremarkable on this noncontrast CT. No portal venous gas. There is
no adenopathy.

Reproductive: Hysterectomy. No adnexal masses.

Other: No

Musculoskeletal: Degenerative changes of the spine. No acute osseous
pathology.
IMPRESSION: 1. A 5 mm left UPJ stone with mild left hydronephrosis.
2. Fatty liver.
3. No bowel obstruction or active inflammation. Normal appendix.

## 2023-03-10 ENCOUNTER — Emergency Department (HOSPITAL_BASED_OUTPATIENT_CLINIC_OR_DEPARTMENT_OTHER)
Admission: EM | Admit: 2023-03-10 | Discharge: 2023-03-10 | Disposition: A | Discharge status: Home / Self Care | Payer: 59 | Attending: Emergency Medicine | Admitting: Emergency Medicine

## 2023-03-10 ENCOUNTER — Encounter (HOSPITAL_BASED_OUTPATIENT_CLINIC_OR_DEPARTMENT_OTHER): Payer: Self-pay | Admitting: Emergency Medicine

## 2023-03-10 ENCOUNTER — Emergency Department (HOSPITAL_BASED_OUTPATIENT_CLINIC_OR_DEPARTMENT_OTHER): Payer: 59

## 2023-03-10 ENCOUNTER — Other Ambulatory Visit: Payer: Self-pay

## 2023-03-10 DIAGNOSIS — M25512 Pain in left shoulder: Secondary | ICD-10-CM | POA: Diagnosis not present

## 2023-03-10 DIAGNOSIS — M25561 Pain in right knee: Secondary | ICD-10-CM | POA: Insufficient documentation

## 2023-03-10 DIAGNOSIS — Z79899 Other long term (current) drug therapy: Secondary | ICD-10-CM | POA: Insufficient documentation

## 2023-03-10 DIAGNOSIS — Z7984 Long term (current) use of oral hypoglycemic drugs: Secondary | ICD-10-CM | POA: Diagnosis not present

## 2023-03-10 DIAGNOSIS — E119 Type 2 diabetes mellitus without complications: Secondary | ICD-10-CM | POA: Insufficient documentation

## 2023-03-10 DIAGNOSIS — M5431 Sciatica, right side: Secondary | ICD-10-CM | POA: Insufficient documentation

## 2023-03-10 DIAGNOSIS — I10 Essential (primary) hypertension: Secondary | ICD-10-CM | POA: Insufficient documentation

## 2023-03-10 DIAGNOSIS — R0981 Nasal congestion: Secondary | ICD-10-CM | POA: Diagnosis not present

## 2023-03-10 DIAGNOSIS — M79604 Pain in right leg: Secondary | ICD-10-CM | POA: Diagnosis present

## 2023-03-10 MED ORDER — KETOROLAC TROMETHAMINE 60 MG/2ML IM SOLN
30.0000 mg | Freq: Once | INTRAMUSCULAR | Status: AC
  Administered 2023-03-10: 30 mg via INTRAMUSCULAR
  Filled 2023-03-10: qty 2

## 2023-03-10 MED ORDER — PREDNISONE 20 MG PO TABS: 40.0000 mg | ORAL_TABLET | Freq: Every day | ORAL | 0 refills | Status: DC

## 2023-03-10 MED ORDER — DICLOFENAC SODIUM 1 % EX GEL: 4.0000 g | Freq: Four times a day (QID) | CUTANEOUS | 1 refills | Status: AC

## 2023-03-10 MED ORDER — METHOCARBAMOL 750 MG PO TABS: 750.0000 mg | ORAL_TABLET | Freq: Two times a day (BID) | ORAL | 0 refills | Status: DC

## 2023-03-10 MED ORDER — KETOROLAC TROMETHAMINE 15 MG/ML IJ SOLN
15.0000 mg | Freq: Once | INTRAMUSCULAR | Status: DC
  Filled 2023-03-10: qty 1

## 2023-03-10 MED ORDER — NAPROXEN 500 MG PO TABS: 500.0000 mg | ORAL_TABLET | Freq: Two times a day (BID) | ORAL | 0 refills | Status: DC | PRN

## 2023-03-10 MED ORDER — METHOCARBAMOL 500 MG PO TABS
500.0000 mg | ORAL_TABLET | Freq: Once | ORAL | Status: AC
  Administered 2023-03-10: 500 mg via ORAL
  Filled 2023-03-10: qty 1

## 2023-03-10 NOTE — ED Triage Notes (Signed)
Patient c/o right knee pain, left elbow pain and a "sizzling sensation" to her face since yesterday. Patient denies injury.

## 2023-03-10 NOTE — ED Provider Notes (Signed)
[facility] Provider Note   CSN: [id number] Arrival date & time: 03/10/23  [id number]     History  Chief Complaint  Patient presents with   Knee Pain    Tara Blevins is a 57 y.o. female.  Patient is a 57 year old female with a history of hypertension, diabetes, renal stones, chronic back issues status post stimulator placement and prior surgeries who is presenting today with complaints of severe right knee pain and left shoulder and elbow pain that started when she woke up this morning.  Patient reports yesterday she was off and just sat around all day watching TV.  She went to bed feeling fine and woke up today with the severe pain.  She denies any injury.  She has had issues with her shoulder hurting in the past and has recently seen an orthopedist for this but denies any neck pain.  She reports her back is not hurting but the pain does seem to radiate into the buttocks and she is having some tingling in the lateral right toes in addition to the pain in her knee.  It is painful to bend the knee and to touch it.  She has not noticed any rashes or significant swelling.  Her left leg feels normal and her right arm is normal.  She has no abdominal pain, nausea or vomiting.  No fever.  In addition to the symptoms patient also reports for the last several weeks she has had sinus issues.  She followed up with her doctor and had a course of steroids and after completing that continued to have green and bloody discharge from her nose and was started on Augmentin earlier this week which she is still taking.  The history is provided by the patient.  Knee Pain      Home Medications Prior to Admission medications   Medication Sig Start Date End Date Taking? Authorizing Provider  amoxicillin-clavulanate (AUGMENTIN) 875-125 MG tablet Take 1 tablet by mouth 2 (two) times daily. 03/07/23  Yes [provider]  diclofenac Sodium (VOLTAREN) 1 % GEL Apply 4  g topically 4 (four) times daily. Apply to knee and leg 03/10/23  Yes Essam Lowdermilk, Loree Fee, MD  methocarbamol (ROBAXIN) 750 MG tablet Take 1 tablet (750 mg total) by mouth 2 (two) times daily. 03/10/23  Yes Blanchie Dessert, MD  naproxen (NAPROSYN) 500 MG tablet Take 1 tablet (500 mg total) by mouth 2 (two) times daily as needed for moderate pain. 03/10/23  Yes Raylynne Cubbage, Loree Fee, MD  predniSONE (DELTASONE) 20 MG tablet Take 2 tablets (40 mg total) by mouth daily. Take the 40mg  for 5 days then take 1 tab (20mg ) for 3 days and then 0.5tab (10mg ) for 2 days 03/10/23  Yes Blanchie Dessert, MD  valACYclovir (VALTREX) 500 MG tablet Take 500 mg by mouth daily.   Yes [provider]  acetaminophen (TYLENOL) 500 MG tablet Take 2 tablets (1,000 mg total) by mouth every 8 (eight) hours as needed. 11/09/20   Lonell Face, NP  bisacodyl (DULCOLAX) 5 MG EC tablet Take 1 tablet (5 mg total) by mouth daily as needed for moderate constipation. 11/09/20   Lonell Face, NP  docusate sodium (COLACE) 100 MG capsule Take 1 capsule (100 mg total) by mouth 2 (two) times daily. 11/09/20   Lonell Face, NP  glipiZIDE (GLUCOTROL XL) 10 MG 24 hr tablet Take 10 mg by mouth daily. 07/21/20   [provider]  lisinopril (ZESTRIL) 5 MG tablet Take 2.5  mg by mouth daily.     [provider]  metFORMIN (GLUCOPHAGE) 500 MG tablet Take 1,000 mg by mouth daily with breakfast.     [provider]  mometasone (ELOCON) 0.1 % cream Apply 1 application topically 2 (two) times a week. 09/30/20   [provider]  polyethylene glycol (MIRALAX / GLYCOLAX) 17 g packet Take 17 g by mouth daily as needed for mild constipation. 11/09/20   Lonell Face, NP  pravastatin (PRAVACHOL) 20 MG tablet Take 20 mg by mouth daily.  09/11/20   [provider]  senna (SENOKOT) 8.6 MG TABS tablet Take 2 tablets (17.2 mg total) by mouth 2 (two) times daily. 11/09/20   Lonell Face, NP  Vitamin D,  Ergocalciferol, (DRISDOL) 50000 units CAPS capsule Take 50,000 Units by mouth every 7 (seven) days.    [provider]      Allergies    Tomato and Bupropion    Review of Systems   Review of Systems  Physical Exam Updated Vital Signs BP (!) 131/91   Pulse 72   Temp 98.2 F (36.8 C) (Oral)   Resp 20   Ht 5' (1.524 m)   Wt 77.1 kg   SpO2 98%   BMI 33.20 kg/m  Physical Exam Vitals and nursing note reviewed.  Constitutional:      Appearance: She is well-developed.     Comments: Appears uncomfortable  HENT:     Head: Normocephalic and atraumatic.     Right Ear: Tympanic membrane normal.     Ears:     Comments: Trace effusion in the left TM    Nose: Congestion present.     Comments: Pain over the left frontal and maxillary sinuses Eyes:     Pupils: Pupils are equal, round, and reactive to light.  Cardiovascular:     Rate and Rhythm: Normal rate and regular rhythm.     Pulses: Normal pulses.     Heart sounds: Normal heart sounds. No murmur heard.    No friction rub.  Pulmonary:     Effort: Pulmonary effort is normal.     Breath sounds: Normal breath sounds. No wheezing or rales.  Abdominal:     General: Bowel sounds are normal. There is no distension.     Palpations: Abdomen is soft.     Tenderness: There is no abdominal tenderness. There is no guarding or rebound.  Musculoskeletal:        General: No tenderness. Normal range of motion.     Comments: No edema.  Stimulator present in the back.  Stimulator pocket is not swollen erythematous or tender.  Patient does have tenderness with palpation over the sciatic notch and down the back of the leg.  Significant tenderness with palpation of the lateral knee but no warmth, swelling, erythema.  No pain in the lower leg or noted swelling.  2+ DP pulses bilaterally.  Also pain in the left shoulder and elbow with palpation but able to fully range both.  No erythema or swelling noted.  2+ radial pulses bilaterally.  No  cervical or thoracic tenderness.  No midline lumbar tenderness.  Skin:    General: Skin is warm and dry.     Findings: No rash.  Neurological:     Mental Status: She is alert and oriented to person, place, and time. Mental status is at baseline.     Cranial Nerves: No cranial nerve deficit.     Sensory: No sensory deficit.  Motor: No weakness.  Psychiatric:        Mood and Affect: Mood normal.        Behavior: Behavior normal.     ED Results / Procedures / Treatments   Labs (all labs ordered are listed, but only abnormal results are displayed) Labs Reviewed - No data to display  EKG None  Radiology DG Shoulder Left  Result Date: 03/10/2023 CLINICAL DATA:  Acute left shoulder pain without known injury. EXAM: LEFT SHOULDER - 2+ VIEW COMPARISON:  None Available. FINDINGS: There is no evidence of fracture or dislocation. There is no evidence of arthropathy or other focal bone abnormality. Soft tissues are unremarkable. IMPRESSION: Negative. Electronically Signed   By: Marijo Conception M.D.   On: 03/10/2023 08:57   DG Elbow Complete Left  Result Date: 03/10/2023 CLINICAL DATA:  Acute left elbow pain without known injury. EXAM: LEFT ELBOW - COMPLETE 3+ VIEW COMPARISON:  None Available. FINDINGS: There is no evidence of fracture, dislocation, or joint effusion. There is no evidence of arthropathy or other focal bone abnormality. Soft tissues are unremarkable. IMPRESSION: Negative. Electronically Signed   By: Marijo Conception M.D.   On: 03/10/2023 08:56   DG Knee Complete 4 Views Right  Result Date: 03/10/2023 CLINICAL DATA:  Acute right knee pain and swelling without known injury. EXAM: RIGHT KNEE - COMPLETE 4+ VIEW COMPARISON:  None Available. FINDINGS: No evidence of fracture, dislocation, or joint effusion. No evidence of arthropathy or other focal bone abnormality. Soft tissues are unremarkable. IMPRESSION: Negative. Electronically Signed   By: Marijo Conception M.D.   On: 03/10/2023  08:54    Procedures Procedures    Medications Ordered in ED Medications  methocarbamol (ROBAXIN) tablet 500 mg (500 mg Oral Given 03/10/23 0908)  ketorolac (TORADOL) injection 30 mg (30 mg Intramuscular Given 03/10/23 [id number])    ED Course/ Medical Decision Making/ A&P                             Medical Decision Making Amount and/or Complexity of Data Reviewed Radiology: ordered.  Risk Prescription drug management.   Patient presenting today with sudden onset of pain in her right knee as well as in her left shoulder and elbow.  No noted trauma.  On exam patient's joints appear normal.  There is no swelling, erythema or effusions.  There is no warmth or rashes noted.  No findings to suggest zoster.  Patient's knee is worse than her upper extremity.  She will use her upper extremity and is able to range it.  However she has significant tenderness over the lateral right knee.  She is vascularly and neurologically intact but does complain of symptoms that suggest possible nerve pain that are going down her leg and wondering if the knee pain is related to sciatica as she does have a stimulator and prior surgery.  I have independently visualized and interpreted pt's images today.  Plain imaging of the right knee, left shoulder and elbow show no acute findings.  There are no findings suggestive of a septic joint, gout.  Low suspicion for infectious etiology at this time.  Patient is urinating and having bowel movements normally and no findings to suggest cord compromise.  All these findings were discussed with the patient.  She was given pain control here.  Will do supportive care at home and close follow-up.  She was given return precautions.  Final Clinical Impression(s) / ED Diagnoses Final diagnoses:  Acute pain of right knee  Sciatica of right side  Acute pain of left shoulder    Rx / DC Orders ED Discharge Orders          Ordered    predniSONE (DELTASONE) 20 MG tablet   Daily        03/10/23 0959    methocarbamol (ROBAXIN) 750 MG tablet  2 times daily        03/10/23 0959    diclofenac Sodium (VOLTAREN) 1 % GEL  4 times daily        03/10/23 0959    naproxen (NAPROSYN) 500 MG tablet  2 times daily PRN        03/10/23 0959              Blanchie Dessert, MD 03/10/23 1139

## 2023-03-10 NOTE — ED Notes (Signed)
Patient transported to X-ray 

## 2023-03-10 NOTE — Discharge Instructions (Signed)
All your x-rays were normal today.  This may be coming from your back but if you develop a rash or your knee becomes red hot and swollen you should return to the emergency room.  Use the medications prescribed for the pain as needed and then you are also giving at steroid pack to help with inflammation.  If your symptoms persist you may need an MRI in the future as this seems to be coming from your back.

## 2023-07-24 ENCOUNTER — Emergency Department (HOSPITAL_BASED_OUTPATIENT_CLINIC_OR_DEPARTMENT_OTHER)
Admission: EM | Admit: 2023-07-24 | Discharge: 2023-07-24 | Disposition: A | Discharge status: Home / Self Care | Payer: 59 | Attending: Emergency Medicine | Admitting: Emergency Medicine

## 2023-07-24 ENCOUNTER — Other Ambulatory Visit: Payer: Self-pay

## 2023-07-24 ENCOUNTER — Emergency Department (HOSPITAL_BASED_OUTPATIENT_CLINIC_OR_DEPARTMENT_OTHER): Payer: 59

## 2023-07-24 ENCOUNTER — Encounter (HOSPITAL_BASED_OUTPATIENT_CLINIC_OR_DEPARTMENT_OTHER): Payer: Self-pay

## 2023-07-24 DIAGNOSIS — M7712 Lateral epicondylitis, left elbow: Secondary | ICD-10-CM | POA: Insufficient documentation

## 2023-07-24 DIAGNOSIS — M7989 Other specified soft tissue disorders: Secondary | ICD-10-CM | POA: Diagnosis present

## 2023-07-24 DIAGNOSIS — I10 Essential (primary) hypertension: Secondary | ICD-10-CM | POA: Insufficient documentation

## 2023-07-24 DIAGNOSIS — M19022 Primary osteoarthritis, left elbow: Secondary | ICD-10-CM | POA: Diagnosis not present

## 2023-07-24 DIAGNOSIS — E119 Type 2 diabetes mellitus without complications: Secondary | ICD-10-CM | POA: Insufficient documentation

## 2023-07-24 DIAGNOSIS — Z79899 Other long term (current) drug therapy: Secondary | ICD-10-CM | POA: Diagnosis not present

## 2023-07-24 DIAGNOSIS — Z7984 Long term (current) use of oral hypoglycemic drugs: Secondary | ICD-10-CM | POA: Diagnosis not present

## 2023-07-24 LAB — CBC
HCT: 35.6 % — ABNORMAL LOW (ref 36.0–46.0)
Hemoglobin: 11.7 g/dL — ABNORMAL LOW (ref 12.0–15.0)
MCH: 30.2 pg (ref 26.0–34.0)
MCHC: 32.9 g/dL (ref 30.0–36.0)
MCV: 91.8 fL (ref 80.0–100.0)
Platelets: 283 10*3/uL (ref 150–400)
RBC: 3.88 MIL/uL (ref 3.87–5.11)
RDW: 13.6 % (ref 11.5–15.5)
WBC: 7.1 10*3/uL (ref 4.0–10.5)
nRBC: 0 % (ref 0.0–0.2)

## 2023-07-24 LAB — COMPREHENSIVE METABOLIC PANEL
ALT: 19 U/L (ref 0–44)
AST: 17 U/L (ref 15–41)
Albumin: 3.5 g/dL (ref 3.5–5.0)
Alkaline Phosphatase: 65 U/L (ref 38–126)
Anion gap: 8 (ref 5–15)
BUN: 21 mg/dL — ABNORMAL HIGH (ref 6–20)
CO2: 25 mmol/L (ref 22–32)
Calcium: 8.7 mg/dL — ABNORMAL LOW (ref 8.9–10.3)
Chloride: 105 mmol/L (ref 98–111)
Creatinine, Ser: 1.06 mg/dL — ABNORMAL HIGH (ref 0.44–1.00)
GFR, Estimated: >60 mL/min (ref >60)
Glucose, Bld: 131 mg/dL — ABNORMAL HIGH (ref 70–99)
Potassium: 4 mmol/L (ref 3.5–5.1)
Sodium: 138 mmol/L (ref 135–145)
Total Bilirubin: 0.5 mg/dL (ref 0.3–1.2)
Total Protein: 7.2 g/dL (ref 6.5–8.1)

## 2023-07-24 NOTE — ED Triage Notes (Signed)
In for eval of left lateral elbow pain and swelling. Denies trauma. Unable to straighten her arm due to pain. Onset 3 weeks. Took 2 tramadol and gabapentin last pm at 2130 without relief. Has not taking any medications today due to nausea from pain.

## 2023-07-24 NOTE — Discharge Instructions (Addendum)
Your x-ray showed some arthritis of the left elbow.   Your pain may be due to your arthritis and also may be due to inflammation of the tendons around your elbow.   You may use up to 800mg  ibuprofen every 6 hours as needed for pain. Do not take this for longer than 1 week.  You may wear the sling provided as needed for pain. Please move your elbow gently as pain allows to prevent stiffness.   Follow-up with your orthopedic provider as discussed for further management of your elbow pain and flare ups.   Your hemoglobin was slightly low today at 11.7. Please make an appointment with your PCP within the next 2 weeks to repeat your CBC to check your hemoglobin. You may need additional blood tests to check your iron as well.

## 2023-07-24 NOTE — ED Provider Notes (Signed)
[facility] Provider Note   CSN: [id number] Arrival date & time: 07/24/23  1314     History  Chief Complaint  Patient presents with   Joint Swelling    Tara Blevins is a 57 y.o. female with history of diabetes, hypertension who presents with concern for left elbow pain for the past 3 weeks.  States the pain and swelling have been intermittent and come and go.  However today it seems to be worse.  Denies any trauma to the area.  States this comes on for about 3 to 4 days at a time and then resolves and then comes back.  It is hard to straighten and bend her improving symptoms.  She has tried taking pregabalin and tramadol last night which did not help her pain.  She denies any fevers or chills.  Denies any numbness or tingling in her hands.  She does have a history of gout which always affects her big toe.  She is currently seeing orthopedics for her shoulder.  HPI     Home Medications Prior to Admission medications   Medication Sig Start Date End Date Taking? Authorizing Provider  acetaminophen (TYLENOL) 500 MG tablet Take 2 tablets (1,000 mg total) by mouth every 8 (eight) hours as needed. 11/09/20   [name], NP  bisacodyl (DULCOLAX) 5 MG EC tablet Take 1 tablet (5 mg total) by mouth daily as needed for moderate constipation. 11/09/20   [name], NP  diclofenac Sodium (VOLTAREN) 1 % GEL Apply 4 g topically 4 (four) times daily. Apply to knee and leg 03/10/23   Gwyneth Sprout, MD  docusate sodium (COLACE) 100 MG capsule Take 1 capsule (100 mg total) by mouth 2 (two) times daily. 11/09/20   [name], NP  glipiZIDE (GLUCOTROL XL) 10 MG 24 hr tablet Take 10 mg by mouth daily. 07/21/20   [provider]  lisinopril (ZESTRIL) 5 MG tablet Take 2.5 mg by mouth daily.     [provider]  metFORMIN (GLUCOPHAGE) 500 MG tablet Take 1,000 mg by mouth daily with breakfast.     [provider]   mometasone (ELOCON) 0.1 % cream Apply 1 application topically 2 (two) times a week. 09/30/20   [provider]  polyethylene glycol (MIRALAX / GLYCOLAX) 17 g packet Take 17 g by mouth daily as needed for mild constipation. 11/09/20   [name], NP  valACYclovir (VALTREX) 500 MG tablet Take 500 mg by mouth daily.    [provider]      Allergies    Tomato and Bupropion    Review of Systems   Review of Systems  Constitutional:  Negative for chills and fever.  Musculoskeletal:        Left elbow pain    Physical Exam Updated Vital Signs BP 129/78 (BP Location: Right Arm)   Pulse 77   Temp 98 F (36.7 C) (Oral)   Resp 17   Ht 5' (1.524 m)   Wt 76.2 kg   SpO2 100%   BMI 32.81 kg/m  Physical Exam Vitals and nursing note reviewed.  Constitutional:      Appearance: Normal appearance.  HENT:     Head: Atraumatic.  Pulmonary:     Effort: Pulmonary effort is normal.  Musculoskeletal:     Comments: No diffuse erythema or edema of the elbow joint, area of erythema and edema strictly over the lateral epicondyle, tender to palpation of the lateral epicondyle, no tenderness  to palpation over the medial epicondyle  Patient unable to fully flex and extend left elbow due to pain   Neurological:     General: No focal deficit present.     Mental Status: She is alert.  Psychiatric:        Mood and Affect: Mood normal.        Behavior: Behavior normal.     ED Results / Procedures / Treatments   Labs (all labs ordered are listed, but only abnormal results are displayed) Labs Reviewed  CBC - Abnormal; Notable for the following components:      Result Value   Hemoglobin 11.7 (*)    HCT 35.6 (*)    All other components within normal limits  COMPREHENSIVE METABOLIC PANEL - Abnormal; Notable for the following components:   Glucose, Bld 131 (*)    BUN 21 (*)    Creatinine, Ser 1.06 (*)    Calcium 8.7 (*)    All other components within normal limits     EKG None  Radiology DG Elbow Complete Left  Result Date: 07/24/2023 CLINICAL DATA:  pain and swelling to left elbow EXAM: LEFT ELBOW - COMPLETE 3+ VIEW COMPARISON:  03/10/2023. FINDINGS: No acute fracture or dislocation. No aggressive osseous lesion. Mild to moderate degenerative arthritis of elbow joint. No radiopaque foreign bodies. Soft tissues are within normal limits. IMPRESSION: No acute fracture or dislocation. 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 Electronically Signed   By: Jules Schick M.D.   On: 07/24/2023 14:45    Procedures Procedures    Medications Ordered in ED Medications - No data to display  ED Course/ Medical Decision Making/ A&P                                 Medical Decision Making Amount and/or Complexity of Data Reviewed Labs: ordered. Radiology: ordered.   57 y.o. female with pertinent past medical history of diabetes, hypertension presents to the ED for concern of left elbow pain  Differential diagnosis includes but is not limited to gout, pseudogout, bursitis, septic arthritis, lateral epicondylitis, rheumatoid arthritis   ED Course:  Patient presents to the ER with concern for left elbow pain that has been intermittent for the past 3 weeks.  She does have slight erythema and edema around the lateral epicondyle, tenderness to palpation over this area.  No tenderness to palpation over the olecranon process or medial epicondyle.  She can flex and extend the elbow, no diffuse erythema of the elbow joint.  Denies any fevers or chills, no leukocytosis, little concern for septic arthritis at this time.  X-ray shows arthritis of the left elbow.  Most concern for gout or pseudogout given location and very localized area of pain, does not involve the whole elbow joint.   Suspect her arthritis and possible lateral epicondylitis is causing her pain.  We placed her in a sling to use for comfort, encouraged her to do gentle range of motion  the elbow.  Checked her kidney function today she has a slightly elevated creatinine at 1.06, however we will treat her with short course of ibuprofen to help with her pain and inflammation.  Discussed this plan with Dr. Charm Barges and he is in agreement.  She already has follow-up with orthopedics for her shoulder in a couple weeks, encouraged her to talk to them about her elbow as well for further management.   Impression: Arthritis  of the left elbow Lateral epicondylitis of the left elbow  Disposition:  The patient was discharged home with instructions to use shoulder sling for comfort and short course of ibuprofen as needed for pain.  Follow-up with orthopedics.  She was also encouraged to follow-up with her primary care provider to follow her slightly low hemoglobin. Return precautions given.  Lab Tests: I Ordered, and personally interpreted labs.  The pertinent results include:   CBC with no leukocytosis, hemoglobin slightly low at 11.7   Imaging Studies ordered: I ordered imaging studies including x ray left elbow  I independently visualized the imaging with scope of interpretation limited to determining acute life threatening conditions related to emergency care. Imaging showed no obvious fracture or dislocations,  I agree with the radiologist interpretation  Co morbidities that complicate the patient evaluation  Diabetes, htn             Final Clinical Impression(s) / ED Diagnoses Final diagnoses:  Arthritis of left elbow  Lateral epicondylitis of left elbow    Rx / DC Orders ED Discharge Orders     None         Arabella Merles, PA-C 07/24/23 1507    Terrilee Files, MD 07/24/23 1729
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
## 2024-05-19 ENCOUNTER — Encounter (HOSPITAL_BASED_OUTPATIENT_CLINIC_OR_DEPARTMENT_OTHER): Payer: Self-pay | Admitting: Plastic Surgery

## 2024-05-19 ENCOUNTER — Encounter (HOSPITAL_BASED_OUTPATIENT_CLINIC_OR_DEPARTMENT_OTHER)
Admission: RE | Admit: 2024-05-19 | Discharge: 2024-05-19 | Disposition: A | Discharge status: Home / Self Care | Source: Ambulatory Visit | Attending: Plastic Surgery | Admitting: Plastic Surgery

## 2024-05-19 ENCOUNTER — Other Ambulatory Visit: Payer: Self-pay

## 2024-05-19 DIAGNOSIS — Z01818 Encounter for other preprocedural examination: Secondary | ICD-10-CM | POA: Diagnosis present

## 2024-05-19 LAB — BASIC METABOLIC PANEL WITH GFR
Anion gap: 11 (ref 5–15)
BUN: 23 mg/dL — ABNORMAL HIGH (ref 6–20)
CO2: 21 mmol/L — ABNORMAL LOW (ref 22–32)
Calcium: 9.1 mg/dL (ref 8.9–10.3)
Chloride: 107 mmol/L (ref 98–111)
Creatinine, Ser: 1.35 mg/dL — ABNORMAL HIGH (ref 0.44–1.00)
GFR, Estimated: 45 mL/min — ABNORMAL LOW (ref >60)
Glucose, Bld: 201 mg/dL — ABNORMAL HIGH (ref 70–99)
Potassium: 4.8 mmol/L (ref 3.5–5.1)
Sodium: 139 mmol/L (ref 135–145)

## 2024-05-19 LAB — CBC
HCT: 42.5 % (ref 36.0–46.0)
Hemoglobin: 13.8 g/dL (ref 12.0–15.0)
MCH: 30.1 pg (ref 26.0–34.0)
MCHC: 32.5 g/dL (ref 30.0–36.0)
MCV: 92.8 fL (ref 80.0–100.0)
Platelets: 281 10*3/uL (ref 150–400)
RBC: 4.58 MIL/uL (ref 3.87–5.11)
RDW: 14.4 % (ref 11.5–15.5)
WBC: 6.2 10*3/uL (ref 4.0–10.5)
nRBC: 0 % (ref 0.0–0.2)

## 2024-05-19 MED ORDER — CHLORHEXIDINE GLUCONATE CLOTH 2 % EX PADS: 6.0000 | MEDICATED_PAD | Freq: Once | CUTANEOUS | Status: DC

## 2024-05-19 MED ORDER — CHLORHEXIDINE GLUCONATE CLOTH 2 % EX PADS
6.0000 | MEDICATED_PAD | Freq: Once | CUTANEOUS | Status: DC
Start: 2024-05-19 — End: 2024-05-26

## 2024-05-19 NOTE — Progress Notes (Signed)

## 2024-05-25 NOTE — Anesthesia Preprocedure Evaluation (Signed)
 Anesthesia Evaluation  Patient identified by MRN, date of birth, ID band Patient awake    Reviewed: Allergy & Precautions, NPO status , Patient's Chart, lab work & pertinent test results  History of Anesthesia Complications Negative for: history of anesthetic complications  Airway Mallampati: II  TM Distance: >3 FB Neck ROM: Full    Dental  (+) Dental Advisory Given   Pulmonary neg pulmonary ROS   breath sounds clear to auscultation       Cardiovascular hypertension, Pt. on medications  Rhythm:Regular Rate:Normal     Neuro/Psych negative neurological ROS     GI/Hepatic negative GI ROS, Neg liver ROS,,,  Endo/Other  diabetes (glu 139), Oral Hypoglycemic Agents  BMI 32  Renal/GU Renal InsufficiencyRenal diseaseH/o stones     Musculoskeletal   Abdominal   Peds  Hematology   Anesthesia Other Findings   Reproductive/Obstetrics                             Anesthesia Physical Anesthesia Plan  ASA: 3  Anesthesia Plan: General   Post-op Pain Management: Tylenol  PO (pre-op)*   Induction: Intravenous  PONV Risk Score and Plan: 3 and Ondansetron , Dexamethasone  and Scopolamine patch - Pre-op  Airway Management Planned: Oral ETT  Additional Equipment: None  Intra-op Plan:   Post-operative Plan: Extubation in OR  Informed Consent: I have reviewed the patients History and Physical, chart, labs and discussed the procedure including the risks, benefits and alternatives for the proposed anesthesia with the patient or authorized representative who has indicated his/her understanding and acceptance.     Dental advisory given  Plan Discussed with: CRNA and Surgeon  Anesthesia Plan Comments:         Anesthesia Quick Evaluation

## 2024-05-26 ENCOUNTER — Ambulatory Visit (HOSPITAL_BASED_OUTPATIENT_CLINIC_OR_DEPARTMENT_OTHER): Admitting: Anesthesiology

## 2024-05-26 ENCOUNTER — Encounter (HOSPITAL_BASED_OUTPATIENT_CLINIC_OR_DEPARTMENT_OTHER): Payer: Self-pay | Admitting: Plastic Surgery

## 2024-05-26 ENCOUNTER — Other Ambulatory Visit: Payer: Self-pay

## 2024-05-26 ENCOUNTER — Ambulatory Visit (HOSPITAL_BASED_OUTPATIENT_CLINIC_OR_DEPARTMENT_OTHER)
Admission: RE | Admit: 2024-05-26 | Discharge: 2024-05-27 | Disposition: A | Discharge status: Home / Self Care | Attending: Plastic Surgery | Admitting: Plastic Surgery

## 2024-05-26 ENCOUNTER — Encounter (HOSPITAL_BASED_OUTPATIENT_CLINIC_OR_DEPARTMENT_OTHER)
Admission: RE | Disposition: A | Discharge status: Home / Self Care | Payer: Self-pay | Source: Home / Self Care | Attending: Plastic Surgery

## 2024-05-26 DIAGNOSIS — I10 Essential (primary) hypertension: Secondary | ICD-10-CM | POA: Diagnosis not present

## 2024-05-26 DIAGNOSIS — Z01818 Encounter for other preprocedural examination: Secondary | ICD-10-CM

## 2024-05-26 DIAGNOSIS — Z7984 Long term (current) use of oral hypoglycemic drugs: Secondary | ICD-10-CM | POA: Diagnosis not present

## 2024-05-26 DIAGNOSIS — M793 Panniculitis, unspecified: Secondary | ICD-10-CM | POA: Diagnosis not present

## 2024-05-26 DIAGNOSIS — E119 Type 2 diabetes mellitus without complications: Secondary | ICD-10-CM | POA: Insufficient documentation

## 2024-05-26 DIAGNOSIS — Z79899 Other long term (current) drug therapy: Secondary | ICD-10-CM | POA: Insufficient documentation

## 2024-05-26 DIAGNOSIS — G8929 Other chronic pain: Secondary | ICD-10-CM | POA: Insufficient documentation

## 2024-05-26 HISTORY — PX: PANNICULECTOMY: SHX5360

## 2024-05-26 LAB — GLUCOSE, CAPILLARY
Glucose-Capillary: 139 mg/dL — ABNORMAL HIGH (ref 70–99)
Glucose-Capillary: 200 mg/dL — ABNORMAL HIGH (ref 70–99)
Glucose-Capillary: 263 mg/dL — ABNORMAL HIGH (ref 70–99)
Glucose-Capillary: 301 mg/dL — ABNORMAL HIGH (ref 70–99)
Glucose-Capillary: 287 mg/dL — ABNORMAL HIGH (ref 70–99)
Glucose-Capillary: 168 mg/dL — ABNORMAL HIGH (ref 70–99)

## 2024-05-26 SURGERY — PANNICULECTOMY
Anesthesia: General | Site: Abdomen

## 2024-05-26 MED ORDER — PROPOFOL 10 MG/ML IV BOLUS
INTRAVENOUS | Status: AC
  Filled 2024-05-26: qty 20

## 2024-05-26 MED ORDER — FENTANYL CITRATE (PF) 100 MCG/2ML IJ SOLN
INTRAMUSCULAR | Status: AC
Start: 2024-05-26 — End: ?
  Filled 2024-05-26: qty 2

## 2024-05-26 MED ORDER — PHENYLEPHRINE 80 MCG/ML (10ML) SYRINGE FOR IV PUSH (FOR BLOOD PRESSURE SUPPORT)
PREFILLED_SYRINGE | INTRAVENOUS | Status: AC
  Filled 2024-05-26: qty 10

## 2024-05-26 MED ORDER — HEPARIN SODIUM (PORCINE) 5000 UNIT/ML IJ SOLN
INTRAMUSCULAR | Status: AC
  Filled 2024-05-26: qty 1

## 2024-05-26 MED ORDER — SCOPOLAMINE 1 MG/3DAYS TD PT72
1.0000 | MEDICATED_PATCH | TRANSDERMAL | Status: DC
  Administered 2024-05-26: 1.5 mg via TRANSDERMAL

## 2024-05-26 MED ORDER — SCOPOLAMINE 1 MG/3DAYS TD PT72
MEDICATED_PATCH | TRANSDERMAL | Status: AC
  Filled 2024-05-26: qty 1

## 2024-05-26 MED ORDER — LIDOCAINE 2% (20 MG/ML) 5 ML SYRINGE
INTRAMUSCULAR | Status: AC
  Filled 2024-05-26: qty 5

## 2024-05-26 MED ORDER — DEXAMETHASONE SODIUM PHOSPHATE 10 MG/ML IJ SOLN
INTRAMUSCULAR | Status: AC
  Filled 2024-05-26: qty 1

## 2024-05-26 MED ORDER — ONDANSETRON HCL 4 MG/2ML IJ SOLN
INTRAMUSCULAR | Status: DC | PRN
  Administered 2024-05-26: 4 mg via INTRAVENOUS

## 2024-05-26 MED ORDER — TRANEXAMIC ACID 1000 MG/10ML IV SOLN
INTRAVENOUS | Status: AC
  Filled 2024-05-26: qty 30

## 2024-05-26 MED ORDER — BUPIVACAINE HCL (PF) 0.5 % IJ SOLN
INTRAMUSCULAR | Status: AC
  Filled 2024-05-26: qty 30

## 2024-05-26 MED ORDER — GABAPENTIN 300 MG PO CAPS
ORAL_CAPSULE | ORAL | Status: AC
  Filled 2024-05-26: qty 1

## 2024-05-26 MED ORDER — KETOROLAC TROMETHAMINE 30 MG/ML IJ SOLN
30.0000 mg | Freq: Three times a day (TID) | INTRAMUSCULAR | Status: DC
  Administered 2024-05-26 – 2024-05-27 (×2): 30 mg via INTRAVENOUS
  Filled 2024-05-26 (×3): qty 1

## 2024-05-26 MED ORDER — HYDROMORPHONE HCL 1 MG/ML IJ SOLN
INTRAMUSCULAR | Status: AC
  Filled 2024-05-26: qty 0.5

## 2024-05-26 MED ORDER — DEXAMETHASONE SODIUM PHOSPHATE 10 MG/ML IJ SOLN
INTRAMUSCULAR | Status: DC | PRN
  Administered 2024-05-26: 5 mg via INTRAVENOUS

## 2024-05-26 MED ORDER — HYDROMORPHONE HCL 1 MG/ML IJ SOLN
INTRAMUSCULAR | Status: AC
Start: 2024-05-26 — End: ?
  Filled 2024-05-26: qty 0.5

## 2024-05-26 MED ORDER — LIDOCAINE 2% (20 MG/ML) 5 ML SYRINGE
INTRAMUSCULAR | Status: DC | PRN
  Administered 2024-05-26: 20 mg via INTRAVENOUS

## 2024-05-26 MED ORDER — ONDANSETRON 4 MG PO TBDP: 4.0000 mg | ORAL_TABLET | Freq: Four times a day (QID) | ORAL | Status: DC | PRN

## 2024-05-26 MED ORDER — HYDROMORPHONE HCL 1 MG/ML IJ SOLN: 0.5000 mg | INTRAMUSCULAR | Status: DC | PRN

## 2024-05-26 MED ORDER — INSULIN ASPART 100 UNIT/ML IJ SOLN
0.0000 [IU] | Freq: Three times a day (TID) | INTRAMUSCULAR | Status: DC
  Administered 2024-05-26: 11 [IU] via SUBCUTANEOUS

## 2024-05-26 MED ORDER — CEFAZOLIN SODIUM-DEXTROSE 2-4 GM/100ML-% IV SOLN
INTRAVENOUS | Status: AC
  Filled 2024-05-26: qty 100

## 2024-05-26 MED ORDER — OXYCODONE HCL 5 MG PO TABS
5.0000 mg | ORAL_TABLET | Freq: Once | ORAL | Status: AC | PRN
  Administered 2024-05-26: 5 mg via ORAL
  Filled 2024-05-26: qty 1

## 2024-05-26 MED ORDER — GABAPENTIN 300 MG PO CAPS
300.0000 mg | ORAL_CAPSULE | ORAL | Status: AC
  Administered 2024-05-26: 300 mg via ORAL

## 2024-05-26 MED ORDER — OXYCODONE HCL 5 MG/5ML PO SOLN: 5.0000 mg | Freq: Once | ORAL | Status: AC | PRN

## 2024-05-26 MED ORDER — CELECOXIB 200 MG PO CAPS
ORAL_CAPSULE | ORAL | Status: AC
  Filled 2024-05-26: qty 1

## 2024-05-26 MED ORDER — ESMOLOL HCL 100 MG/10ML IV SOLN
INTRAVENOUS | Status: AC
  Filled 2024-05-26: qty 10

## 2024-05-26 MED ORDER — PROPOFOL 10 MG/ML IV BOLUS
INTRAVENOUS | Status: DC | PRN
  Administered 2024-05-26 (×2): 50 mg via INTRAVENOUS
  Administered 2024-05-26: 150 mg via INTRAVENOUS

## 2024-05-26 MED ORDER — ACETAMINOPHEN 500 MG PO TABS: 1000.0000 mg | ORAL_TABLET | ORAL | Status: AC

## 2024-05-26 MED ORDER — ONDANSETRON HCL 4 MG/2ML IJ SOLN: 4.0000 mg | Freq: Four times a day (QID) | INTRAMUSCULAR | Status: DC | PRN

## 2024-05-26 MED ORDER — LIDOCAINE-EPINEPHRINE (PF) 1 %-1:200000 IJ SOLN
INTRAMUSCULAR | Status: AC
  Filled 2024-05-26: qty 30

## 2024-05-26 MED ORDER — ESMOLOL HCL 100 MG/10ML IV SOLN
INTRAVENOUS | Status: DC | PRN
  Administered 2024-05-26: 20 mg via INTRAVENOUS

## 2024-05-26 MED ORDER — GLIPIZIDE ER 10 MG PO TB24
10.0000 mg | ORAL_TABLET | Freq: Every day | ORAL | Status: DC
  Administered 2024-05-26: 10 mg via ORAL
  Filled 2024-05-26: qty 1

## 2024-05-26 MED ORDER — LACTATED RINGERS IV SOLN: INTRAVENOUS | Status: DC

## 2024-05-26 MED ORDER — SODIUM CHLORIDE 0.9 % IV SOLN: INTRAVENOUS | Status: DC

## 2024-05-26 MED ORDER — ROCURONIUM BROMIDE 10 MG/ML (PF) SYRINGE
PREFILLED_SYRINGE | INTRAVENOUS | Status: AC
  Filled 2024-05-26: qty 10

## 2024-05-26 MED ORDER — ROCURONIUM BROMIDE 100 MG/10ML IV SOLN
INTRAVENOUS | Status: DC | PRN
  Administered 2024-05-26: 60 mg via INTRAVENOUS

## 2024-05-26 MED ORDER — METHOCARBAMOL 500 MG PO TABS
500.0000 mg | ORAL_TABLET | Freq: Four times a day (QID) | ORAL | Status: DC | PRN
  Administered 2024-05-26: 500 mg via ORAL
  Filled 2024-05-26: qty 1

## 2024-05-26 MED ORDER — BUPIVACAINE HCL (PF) 0.5 % IJ SOLN
INTRAMUSCULAR | Status: DC | PRN
  Administered 2024-05-26: 30 mL

## 2024-05-26 MED ORDER — ONDANSETRON HCL 4 MG/2ML IJ SOLN
INTRAMUSCULAR | Status: AC
  Filled 2024-05-26: qty 2

## 2024-05-26 MED ORDER — 0.9 % SODIUM CHLORIDE (POUR BTL) OPTIME
TOPICAL | Status: DC | PRN
  Administered 2024-05-26: 1000 mL

## 2024-05-26 MED ORDER — METFORMIN HCL 500 MG PO TABS
1000.0000 mg | ORAL_TABLET | Freq: Every day | ORAL | Status: DC
  Administered 2024-05-27: 1000 mg via ORAL
  Filled 2024-05-26: qty 2

## 2024-05-26 MED ORDER — ACETAMINOPHEN 500 MG PO TABS
ORAL_TABLET | ORAL | Status: AC
  Filled 2024-05-26: qty 2

## 2024-05-26 MED ORDER — HYDROMORPHONE HCL 1 MG/ML IJ SOLN
0.2500 mg | INTRAMUSCULAR | Status: DC | PRN
  Administered 2024-05-26 (×2): 0.5 mg via INTRAVENOUS

## 2024-05-26 MED ORDER — MIDAZOLAM HCL 5 MG/5ML IJ SOLN
INTRAMUSCULAR | Status: DC | PRN
  Administered 2024-05-26: 2 mg via INTRAVENOUS

## 2024-05-26 MED ORDER — OXYCODONE HCL 5 MG PO TABS
5.0000 mg | ORAL_TABLET | ORAL | Status: DC | PRN
  Administered 2024-05-26 – 2024-05-27 (×2): 5 mg via ORAL
  Filled 2024-05-26 (×2): qty 1

## 2024-05-26 MED ORDER — SUGAMMADEX SODIUM 200 MG/2ML IV SOLN
INTRAVENOUS | Status: DC | PRN
  Administered 2024-05-26: 200 mg via INTRAVENOUS

## 2024-05-26 MED ORDER — EMPAGLIFLOZIN 10 MG PO TABS
10.0000 mg | ORAL_TABLET | Freq: Every day | ORAL | Status: DC
  Administered 2024-05-26: 10 mg via ORAL
  Filled 2024-05-26: qty 1

## 2024-05-26 MED ORDER — FENTANYL CITRATE (PF) 100 MCG/2ML IJ SOLN
INTRAMUSCULAR | Status: AC
  Filled 2024-05-26: qty 2

## 2024-05-26 MED ORDER — MIDAZOLAM HCL 2 MG/2ML IJ SOLN: 0.5000 mg | Freq: Once | INTRAMUSCULAR | Status: DC | PRN

## 2024-05-26 MED ORDER — HYDROMORPHONE HCL 1 MG/ML IJ SOLN
INTRAMUSCULAR | Status: DC | PRN
  Administered 2024-05-26 (×2): .5 mg via INTRAVENOUS

## 2024-05-26 MED ORDER — TRANEXAMIC ACID 1000 MG/10ML IV SOLN
Status: DC | PRN
  Administered 2024-05-26: 3000 mg via TOPICAL

## 2024-05-26 MED ORDER — CELECOXIB 200 MG PO CAPS
200.0000 mg | ORAL_CAPSULE | ORAL | Status: AC
  Administered 2024-05-26: 200 mg via ORAL

## 2024-05-26 MED ORDER — ENOXAPARIN SODIUM 40 MG/0.4ML IJ SOSY
40.0000 mg | PREFILLED_SYRINGE | INTRAMUSCULAR | Status: DC
Start: 2024-05-27 — End: 2024-05-27
  Administered 2024-05-27: 40 mg via SUBCUTANEOUS
  Filled 2024-05-26: qty 0.4

## 2024-05-26 MED ORDER — FENTANYL CITRATE (PF) 100 MCG/2ML IJ SOLN
INTRAMUSCULAR | Status: DC | PRN
  Administered 2024-05-26: 100 ug via INTRAVENOUS
  Administered 2024-05-26 (×2): 50 ug via INTRAVENOUS

## 2024-05-26 MED ORDER — ACETAMINOPHEN 500 MG PO TABS
1000.0000 mg | ORAL_TABLET | Freq: Once | ORAL | Status: AC
  Administered 2024-05-26: 1000 mg via ORAL

## 2024-05-26 MED ORDER — CEFAZOLIN SODIUM-DEXTROSE 2-4 GM/100ML-% IV SOLN
2.0000 g | INTRAVENOUS | Status: AC
  Administered 2024-05-26: 2 g via INTRAVENOUS

## 2024-05-26 MED ORDER — MIDAZOLAM HCL 2 MG/2ML IJ SOLN
INTRAMUSCULAR | Status: AC
  Filled 2024-05-26: qty 2

## 2024-05-26 MED ORDER — HEPARIN SODIUM (PORCINE) 5000 UNIT/ML IJ SOLN
5000.0000 [IU] | Freq: Once | INTRAMUSCULAR | Status: AC
Start: 2024-05-26 — End: 2024-05-26
  Administered 2024-05-26: 5000 [IU] via SUBCUTANEOUS

## 2024-05-26 MED ORDER — LISINOPRIL 2.5 MG PO TABS
2.5000 mg | ORAL_TABLET | Freq: Every day | ORAL | Status: DC
  Administered 2024-05-26: 2.5 mg via ORAL
  Filled 2024-05-26: qty 1

## 2024-05-26 SURGICAL SUPPLY — 50 items
BINDER ABDOMINAL 10 UNV 27-48 (MISCELLANEOUS) IMPLANT
BINDER ABDOMINAL 12 SM 30-45 (SOFTGOODS) IMPLANT
BLADE CLIPPER SURG (BLADE) IMPLANT
BLADE SURG 10 STRL SS (BLADE) ×4 IMPLANT
BLADE SURG 11 STRL SS (BLADE) ×2 IMPLANT
BLADE SURG 15 STRL LF DISP TIS (BLADE) IMPLANT
CANISTER SUCT 1200ML W/VALVE (MISCELLANEOUS) ×2 IMPLANT
CHLORAPREP W/TINT 26 (MISCELLANEOUS) ×2 IMPLANT
CLIP APPLIE 9.375 MED OPEN (MISCELLANEOUS) ×2 IMPLANT
COVER BACK TABLE 60X90IN (DRAPES) ×2 IMPLANT
COVER MAYO STAND STRL (DRAPES) ×2 IMPLANT
DERMABOND ADVANCED .7 DNX12 (GAUZE/BANDAGES/DRESSINGS) ×4 IMPLANT
DRAIN CHANNEL 15F RND FF W/TCR (WOUND CARE) ×4 IMPLANT
DRAIN CHANNEL 19F RND (DRAIN) IMPLANT
DRAPE TOP ARMCOVERS (MISCELLANEOUS) ×2 IMPLANT
DRAPE U-SHAPE 76X120 STRL (DRAPES) ×2 IMPLANT
DRAPE UTILITY XL STRL (DRAPES) ×2 IMPLANT
ELECT COATED BLADE 2.86 ST (ELECTRODE) IMPLANT
ELECTRODE BLDE 4.0 EZ CLN MEGD (MISCELLANEOUS) IMPLANT
ELECTRODE REM PT RTRN 9FT ADLT (ELECTROSURGICAL) ×2 IMPLANT
EVACUATOR SILICONE 100CC (DRAIN) ×4 IMPLANT
GAUZE PAD ABD 8X10 STRL (GAUZE/BANDAGES/DRESSINGS) ×4 IMPLANT
GAUZE XEROFORM 1X8 LF (GAUZE/BANDAGES/DRESSINGS) IMPLANT
GLOVE BIO SURGEON STRL SZ 6 (GLOVE) ×6 IMPLANT
GOWN STRL REUS W/ TWL LRG LVL3 (GOWN DISPOSABLE) ×4 IMPLANT
NDL HYPO 25X1 1.5 SAFETY (NEEDLE) IMPLANT
NEEDLE HYPO 25X1 1.5 SAFETY (NEEDLE) IMPLANT
NS IRRIG 1000ML POUR BTL (IV SOLUTION) ×2 IMPLANT
PACK BASIN DAY SURGERY FS (CUSTOM PROCEDURE TRAY) ×2 IMPLANT
PENCIL SMOKE EVACUATOR (MISCELLANEOUS) ×2 IMPLANT
PIN SAFETY STERILE (MISCELLANEOUS) ×2 IMPLANT
SHEET MEDIUM DRAPE 40X70 STRL (DRAPES) ×4 IMPLANT
SLEEVE SCD COMPRESS KNEE MED (STOCKING) ×2 IMPLANT
SPONGE T-LAP 18X18 ~~LOC~~+RFID (SPONGE) ×4 IMPLANT
STAPLER SKIN PROX WIDE 3.9 (STAPLE) ×2 IMPLANT
SUT ETHILON 2 0 FS 18 (SUTURE) ×4 IMPLANT
SUT MNCRL AB 3-0 PS2 27 (SUTURE) IMPLANT
SUT MNCRL AB 4-0 PS2 18 (SUTURE) ×4 IMPLANT
SUT PDS AB 0 CT 36 (SUTURE) ×2 IMPLANT
SUT PDS AB 2-0 CT2 27 (SUTURE) IMPLANT
SUT VICRYL RAPID 5 0 P 3 (SUTURE) IMPLANT
SUT VICRYL RAPIDE 4-0 (SUTURE) IMPLANT
SUTURE STRATFX 0 PDS 27 VIOLET (SUTURE) ×2 IMPLANT
SYR BULB IRRIG 60ML STRL (SYRINGE) ×2 IMPLANT
SYR CONTROL 10ML LL (SYRINGE) IMPLANT
TOWEL GREEN STERILE FF (TOWEL DISPOSABLE) ×2 IMPLANT
TRAY FOLEY W/BAG SLVR 14FR LF (SET/KITS/TRAYS/PACK) IMPLANT
TUBE CONNECTING 20X1/4 (TUBING) ×2 IMPLANT
UNDERPAD 30X36 HEAVY ABSORB (UNDERPADS AND DIAPERS) ×4 IMPLANT
YANKAUER SUCT BULB TIP NO VENT (SUCTIONS) ×2 IMPLANT

## 2024-05-26 NOTE — Anesthesia Postprocedure Evaluation (Signed)
 Anesthesia Post Note  Patient: Tara Blevins  Procedure(s) Performed: PANNICULECTOMY (Abdomen)     Patient location during evaluation: Nursing Unit Anesthesia Type: General Level of consciousness: awake and alert, oriented and patient cooperative Pain management: pain level controlled (pain improving) Vital Signs Assessment: post-procedure vital signs reviewed and stable Respiratory status: spontaneous breathing, nonlabored ventilation and respiratory function stable Cardiovascular status: blood pressure returned to baseline and stable Postop Assessment: no apparent nausea or vomiting Anesthetic complications: no  No notable events documented.  Last Vitals:  Vitals:   05/26/24 1215 05/26/24 1232  BP: 131/72 135/88  Pulse: 95 91  Resp: 20 20  Temp:  36.6 C  SpO2: 94% 99%    Last Pain:  Vitals:   05/26/24 1232  TempSrc:   PainSc: 6                  Carliss Porcaro,E. Nolah Krenzer

## 2024-05-26 NOTE — Transfer of Care (Signed)
 Immediate Anesthesia Transfer of Care Note  Patient: Tara Blevins  Procedure(s) Performed: PANNICULECTOMY (Abdomen)  Patient Location: PACU  Anesthesia Type:General  Level of Consciousness: drowsy  Airway & Oxygen Therapy: Patient Spontanous Breathing and Patient connected to face mask oxygen  Post-op Assessment: Report given to RN and Post -op Vital signs reviewed and stable  Post vital signs: Reviewed and stable  Last Vitals:  Vitals Value Taken Time  BP 168/84 05/26/24 1049  Temp    Pulse 112 05/26/24 1050  Resp 13 05/26/24 1050  SpO2 97 % 05/26/24 1050  Vitals shown include unfiled device data.  Last Pain:  Vitals:   05/26/24 0639  TempSrc: Oral  PainSc: 0-No pain      Patients Stated Pain Goal: 7 (05/26/24 9604)  Complications: No notable events documented.

## 2024-05-26 NOTE — Op Note (Signed)
 Operative Note   DATE OF OPERATION: 6.9.2025  LOCATION: [facility]-outpatient  SURGICAL DIVISION: Plastic Surgery  PREOPERATIVE DIAGNOSES:  1. Panniculitis  POSTOPERATIVE DIAGNOSES:  same  PROCEDURE:  Panniculectomy  SURGEON: Alger Infield MD MBA  ASSISTANT: none  ANESTHESIA:  General.   EBL: 25 ml  COMPLICATIONS: None immediate.   INDICATIONS FOR PROCEDURE:  The patient, Tara Blevins, is a 58 y.o. female born on [birth year], is here for treatment chronic panniculitis that has failed conservative measures.   FINDINGS: Abdominal soft tissue resection 4307 g  DESCRIPTION OF PROCEDURE:  The patient's caudal incision was marked 7 cm cephalad to anterior fourchette within prior scar. Incision was extended over lateral abdomen. Subcutaneous heparin administered in preoperative area. The patient was taken to the operating room. SCDs were placed and IV antibiotics were given. The patient's operative site was prepped and draped in a sterile fashion. A time out was performed and all information was confirmed to be correct.     Low transverse abdominal incision carried through superficial fascia to abdominal wall. Skin flap elevated in sub Scarpa's layer, taking care to leave layer of subfascial fat over abdominal wall fascia. Dissection completed toward umbilicus. Umbilicus sharply incised and scissor dissection completed to free from abdominal skin flap. Additional dissection completed in midline toward xiphoid. Wound irrigated and hemostasis obtained. Local anesthetic infiltrated. Caudal extent skin excision marked by palpation. Area marked excised. 19 Fr JP placed in subcutaneous right and left abdomen and secured with 2-0 nylon. Superiorly based U shaped skin flap incised for delivery umbilicus. Low transverse abdominal skin incision closed with 0 PDS interrupted in superficial fascia. 0 Strattafix used to close dermis and 3-0 monocryl used for subcuticular skin closure. Umbilicus  inset with 4-0 monocryl in dermis and 5-0 vicryl rapide for skin closure. Xeroform bolster placed within umbilicus. Tissue adhesive applied to low transverse incision.   Dry dressing and abdominal binder applied. The patient was allowed to wake from anesthesia, extubated and taken to the recovery room in satisfactory condition.  SPECIMENS: none  DRAINS: 19 Fr JP in right and left subcutaneous abdomen  Alger Infield, MD [facility]  Office/ physician access line after hours [phone]

## 2024-05-26 NOTE — Interval H&P Note (Signed)
 History and Physical Interval Note:  05/26/2024 6:55 AM  Tara Blevins  has presented today for surgery, with the diagnosis of Panniculitis.  The various methods of treatment have been discussed with the patient and family. After consideration of risks, benefits and other options for treatment, the patient has consented to  Procedure(s): PANNICULECTOMY (N/A) as a surgical intervention.  The patient's history has been reviewed, patient examined, no change in status, stable for surgery.  I have reviewed the patient's chart and labs.  Questions were answered to the patient's satisfaction.     Lisabeth Rider [name]

## 2024-05-26 NOTE — Anesthesia Procedure Notes (Signed)
 Procedure Name: Intubation Date/Time: 05/26/2024 7:33 AM  Performed by: Noralyn Beams, CRNAPre-anesthesia Checklist: Patient identified, Emergency Drugs available, Suction available and Patient being monitored Patient Re-evaluated:Patient Re-evaluated prior to induction Oxygen Delivery Method: Circle system utilized Preoxygenation: Pre-oxygenation with 100% oxygen Induction Type: IV induction Ventilation: Mask ventilation without difficulty Laryngoscope Size: Mac and 3 Grade View: Grade II Tube type: Oral Tube size: 7.0 mm Number of attempts: 1 Airway Equipment and Method: Stylet and Bite block Placement Confirmation: ETT inserted through vocal cords under direct vision, positive ETCO2 and breath sounds checked- equal and bilateral Secured at: 22 cm Tube secured with: Tape Dental Injury: Teeth and Oropharynx as per pre-operative assessment

## 2024-05-27 ENCOUNTER — Encounter (HOSPITAL_BASED_OUTPATIENT_CLINIC_OR_DEPARTMENT_OTHER): Payer: Self-pay | Admitting: Plastic Surgery

## 2024-05-27 DIAGNOSIS — M793 Panniculitis, unspecified: Secondary | ICD-10-CM | POA: Diagnosis not present

## 2024-05-27 LAB — GLUCOSE, CAPILLARY: Glucose-Capillary: 149 mg/dL — ABNORMAL HIGH (ref 70–99)

## 2024-05-27 NOTE — Discharge Summary (Signed)
 Physician Discharge Summary  Patient ID: Tara Blevins MRN: [id number] DOB/AGE: 1966-10-05 58 y.o.  Admit date: 05/26/2024 Discharge date: 05/27/2024  Admission Diagnoses: Panniculitis  Discharge Diagnoses:  Principal Problem:   Panniculitis   Discharged Condition: stable  Hospital Course: Post operatively patient did well with pain controlled, tolerating diet and ambulatory with minimal assist. Patient instructed on drain care and bathing.   Treatments: surgery: panniculectomy 6.9.2025  Discharge Exam: Blood pressure (!) 98/53, pulse 62, temperature 97.8 F (36.6 C), resp. rate 16, height 5' (1.524 m), weight 76.3 kg, SpO2 98%. Incision/Wound: incision intact scant drainage umbilicus viable soft drains serosanguinous  Disposition: Discharge disposition: 01-Home or Self Care       Discharge Instructions     Call MD for:  redness, tenderness, or signs of infection (pain, swelling, bleeding, redness, odor or green/yellow discharge around incision site)   Complete by: As directed    Call MD for:  temperature >100.5   Complete by: As directed    Discharge instructions   Complete by: As directed    Ok to remove dressings and shower am 6.11.2025. Soap and water ok, pat incisions dry. No creams or ointments over incisions. Do not let drains dangle in shower, attach to lanyard or similar.Strip and record drains twice daily and bring log to clinic visit.  Abdominal binder or compression garment all other times.  Ok to raise arms above shoulders for bathing and dressing.  No house yard work or exercise until cleared by MD.   Patient received all Rx preop. Recommend Tylenol  and Motrin to aid with pain control. Ice packs to area for comfort. Recommend Miralax  or Dulcolax as needed for constipation.   Do not sleep or lie flat through follow up visit. Sleep with head elevated on 2-3 pillows and pillow beneath knees. Walk bent at hip.   Driving Restrictions   Complete by: As  directed    No driving if taking prescription pain medication   Lifting restrictions   Complete by: As directed    No lifting > 5-10 lbs until cleared by MD   Resume previous diet   Complete by: As directed       Allergies as of 05/27/2024       Reactions   Tomato Hives   Bupropion Anxiety        Medication List     TAKE these medications    acetaminophen  500 MG tablet Commonly known as: TYLENOL  Take 2 tablets (1,000 mg total) by mouth every 8 (eight) hours as needed.   aspirin EC 81 MG tablet Take 81 mg by mouth daily. Swallow whole.   diclofenac  Sodium 1 % Gel Commonly known as: Voltaren  Apply 4 g topically 4 (four) times daily. Apply to knee and leg   DICYCLOMINE HCL PO Take by mouth.   glipiZIDE 10 MG 24 hr tablet Commonly known as: GLUCOTROL XL Take 10 mg by mouth daily.   JARDIANCE PO Take by mouth.   lisinopril  5 MG tablet Commonly known as: ZESTRIL  Take 2.5 mg by mouth daily.   metFORMIN 500 MG tablet Commonly known as: GLUCOPHAGE Take 1,000 mg by mouth daily with breakfast.   mometasone 0.1 % cream Commonly known as: ELOCON Apply 1 application topically 2 (two) times a week.   valACYclovir 500 MG tablet Commonly known as: VALTREX Take 500 mg by mouth daily.        Follow-up Information     Alger Infield, MD Follow up in 1 week(s).  Specialty: Plastic Surgery Why: as scheduled Contact information: [address], [address] [place] [address] [phone]                 Signed: Alger Infield 05/27/2024, 6:45 AM
